# Patient Record
Sex: Female | Born: 1955 | Race: Black or African American | Hispanic: No | Marital: Married | Smoking: Never smoker
Health system: Southern US, Community
[De-identification: ages and names within clinical notes are randomized; demographics above are authoritative.]

## PROBLEM LIST (undated history)

## (undated) ENCOUNTER — Emergency Department (HOSPITAL_COMMUNITY): Discharge status: Absent without leave | Payer: Self-pay

## (undated) DIAGNOSIS — G8929 Other chronic pain: Secondary | ICD-10-CM

## (undated) DIAGNOSIS — G709 Myoneural disorder, unspecified: Secondary | ICD-10-CM

## (undated) DIAGNOSIS — I10 Essential (primary) hypertension: Secondary | ICD-10-CM

## (undated) DIAGNOSIS — M1990 Unspecified osteoarthritis, unspecified site: Secondary | ICD-10-CM

## (undated) DIAGNOSIS — R112 Nausea with vomiting, unspecified: Secondary | ICD-10-CM

## (undated) DIAGNOSIS — Z9889 Other specified postprocedural states: Secondary | ICD-10-CM

## (undated) DIAGNOSIS — Z8719 Personal history of other diseases of the digestive system: Secondary | ICD-10-CM

## (undated) DIAGNOSIS — Z8611 Personal history of tuberculosis: Secondary | ICD-10-CM

## (undated) DIAGNOSIS — Z8701 Personal history of pneumonia (recurrent): Secondary | ICD-10-CM

## (undated) DIAGNOSIS — R202 Paresthesia of skin: Secondary | ICD-10-CM

## (undated) DIAGNOSIS — D649 Anemia, unspecified: Secondary | ICD-10-CM

## (undated) DIAGNOSIS — R51 Headache: Secondary | ICD-10-CM

## (undated) DIAGNOSIS — R519 Headache, unspecified: Secondary | ICD-10-CM

## (undated) DIAGNOSIS — Z973 Presence of spectacles and contact lenses: Secondary | ICD-10-CM

## (undated) DIAGNOSIS — R200 Anesthesia of skin: Secondary | ICD-10-CM

## (undated) DIAGNOSIS — R9431 Abnormal electrocardiogram [ECG] [EKG]: Secondary | ICD-10-CM

## (undated) HISTORY — PX: CHOLECYSTECTOMY: SHX55

## (undated) HISTORY — PX: ESOPHAGOGASTRODUODENOSCOPY: SHX1529

## (undated) HISTORY — PX: COLONOSCOPY: SHX174

---

## 1998-11-22 ENCOUNTER — Ambulatory Visit (HOSPITAL_COMMUNITY): Admission: RE | Admit: 1998-11-22 | Discharge: 1998-11-22 | Payer: Self-pay | Admitting: *Deleted

## 1998-11-22 ENCOUNTER — Encounter: Payer: Self-pay | Admitting: *Deleted

## 1999-09-09 ENCOUNTER — Encounter: Payer: Self-pay | Admitting: Family Medicine

## 1999-09-09 ENCOUNTER — Ambulatory Visit (HOSPITAL_COMMUNITY): Admission: RE | Admit: 1999-09-09 | Discharge: 1999-09-09 | Payer: Self-pay | Admitting: Family Medicine

## 1999-09-15 ENCOUNTER — Encounter: Payer: Self-pay | Admitting: Family Medicine

## 1999-09-15 ENCOUNTER — Ambulatory Visit (HOSPITAL_COMMUNITY): Admission: RE | Admit: 1999-09-15 | Discharge: 1999-09-15 | Payer: Self-pay | Admitting: Family Medicine

## 2000-07-05 ENCOUNTER — Encounter: Payer: Self-pay | Admitting: Family Medicine

## 2000-07-05 ENCOUNTER — Ambulatory Visit (HOSPITAL_COMMUNITY): Admission: RE | Admit: 2000-07-05 | Discharge: 2000-07-05 | Payer: Self-pay | Admitting: Family Medicine

## 2002-01-24 ENCOUNTER — Emergency Department (HOSPITAL_COMMUNITY): Admission: EM | Admit: 2002-01-24 | Discharge: 2002-01-24 | Payer: Self-pay | Admitting: Unknown Physician Specialty

## 2002-01-24 ENCOUNTER — Encounter: Payer: Self-pay | Admitting: Emergency Medicine

## 2002-02-01 ENCOUNTER — Encounter: Payer: Self-pay | Admitting: Family Medicine

## 2002-02-01 ENCOUNTER — Ambulatory Visit (HOSPITAL_COMMUNITY): Admission: RE | Admit: 2002-02-01 | Discharge: 2002-02-01 | Payer: Self-pay | Admitting: Family Medicine

## 2010-06-11 ENCOUNTER — Encounter (INDEPENDENT_AMBULATORY_CARE_PROVIDER_SITE_OTHER): Payer: Self-pay | Admitting: *Deleted

## 2010-10-09 NOTE — Letter (Signed)
Summary: Referral - not able to see patient  [facility]  [address] [place], [place] [address]   Phone: [phone]  Fax: [phone]      June 11, 2010   [facility] [address] [place], [place] [address]    Re:   [name] DOB:  1956/07/19 MRN:   [id number]    Dear Dr. Tinnie Gens:  Thank you for your kind referral of the above patient.  We have attempted to schedule the recommended procedure Screening Colonoscopy but have not been able to schedule because:  ___ The patient was not available by phone and/or has not returned our calls.   X  The patient declined to schedule the procedure at this time.  We appreciate the referral and hope that we will have the opportunity to treat this patient in the future.    Sincerely,    [facility] [phone]

## 2013-03-14 ENCOUNTER — Ambulatory Visit: Payer: Self-pay | Admitting: Podiatry

## 2013-03-15 ENCOUNTER — Ambulatory Visit: Payer: Self-pay | Admitting: Podiatry

## 2013-03-17 ENCOUNTER — Encounter: Payer: Self-pay | Admitting: Podiatry

## 2013-03-17 ENCOUNTER — Ambulatory Visit (INDEPENDENT_AMBULATORY_CARE_PROVIDER_SITE_OTHER): Payer: BC Managed Care – PPO | Admitting: Podiatry

## 2013-03-17 DIAGNOSIS — M722 Plantar fascial fibromatosis: Secondary | ICD-10-CM | POA: Insufficient documentation

## 2013-03-17 DIAGNOSIS — M21969 Unspecified acquired deformity of unspecified lower leg: Secondary | ICD-10-CM | POA: Insufficient documentation

## 2013-03-17 MED ORDER — TRIAMCINOLONE ACETONIDE 40 MG/ML IJ SUSP: 5.0000 mg | Freq: Once | INTRAMUSCULAR | Status: DC

## 2013-03-17 MED ORDER — ARCH BANDAGE MISC: 1.0000 | Status: AC

## 2013-03-17 MED ORDER — DEXAMETHASONE SODIUM PHOSPHATE 10 MG/ML IJ SOLN: 5.0000 mg | Freq: Once | INTRAMUSCULAR | Status: DC

## 2013-03-17 NOTE — Progress Notes (Signed)
Subjective: 57 year old self employed, presents complaining of heel pain when get up in  the morning or after sit for a while, or sometimes when walking, right heel feels like something is stabbing. Has done stretching or rolling pan/ball to stretch without much improvement. No previous medical treatment done. Pain is more on right, sometimes on left also.  Never had this problem before. It was the worst pain ever had.  Wears shoes with good support in arch.  She is scheduled to go on mission trip to [place] this Monday and wants dome immediate relief.   Objective: Pain right plantar heel, occasional pain on left.  Hypermobile first ray bilateral. Elevated first ray bilateral. Mild bunion deformity bilateral. Good ankle dorsiflexion bilateral. Right side is less limber compared with the left side.  Normal forefoot and rearfoot range of motion, muscle strength, and tones. All pedal pulses are palpable. No edema or erythema noted.  Assessment: Plantar fasciitis R>L. Hypermobile first ray bilateral. Bilateral bunion deformity.  Plan: Injection to right heel given. Injection consisted of 5mg  Triamcinolone, 5mg  Dexamethasone, 1 ml of 0.5% Marcaine plain and 1ml of 1% Xylocaine plain. Metatarsal binder x 2 given. Explained the need for Orthotic shoe inserts.  Return in 2 weeks for follow up.

## 2013-03-31 ENCOUNTER — Ambulatory Visit (INDEPENDENT_AMBULATORY_CARE_PROVIDER_SITE_OTHER): Payer: BC Managed Care – PPO | Admitting: Podiatry

## 2013-03-31 ENCOUNTER — Encounter: Payer: Self-pay | Admitting: Podiatry

## 2013-03-31 VITALS — BP 127/80 | HR 97 | Ht 64.0 in | Wt 190.0 lb

## 2013-03-31 DIAGNOSIS — M21969 Unspecified acquired deformity of unspecified lower leg: Secondary | ICD-10-CM

## 2013-03-31 DIAGNOSIS — M722 Plantar fascial fibromatosis: Secondary | ICD-10-CM

## 2013-03-31 MED ORDER — TRIAMCINOLONE ACETONIDE 40 MG/ML IJ SUSP: 5.0000 mg | Freq: Once | INTRAMUSCULAR | Status: DC

## 2013-03-31 MED ORDER — DEXAMETHASONE SODIUM PHOSPHATE 10 MG/ML IJ SOLN: 5.0000 mg | Freq: Once | INTRAMUSCULAR | Status: DC

## 2013-03-31 NOTE — Progress Notes (Signed)
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 She was on her feet helping out with the project. Now her left foot hurt more with leg cramp. The right foot hurts less. Patient wants to have injection on the left and prepare for Orthotics.  Objective: No changes in findings other than much sore heel on the left foot.  No edema or erythema noted on lower limbs.  X-ray both feet done. X-ray findings reveal cavus type foot with significant dorsal displacement of the first ray in lateral view. Plantar calcaneal spur is present in minimum degree. Slightly short first ray with long proximal phalanx of the hallux, which makes long big toe length overall.  Calcaneocuboid lateral deviation is minimum. Subtalar joint subluxation is minimum.  No other abnormal findings noted.   Assessment: 1. Plantar fasciitis bilateral. 2. Metatarsus Primus Elevatus bilateral.  Treatment: 1. Injection to left heel given with a mixture of 4mg  Dexamethasone, 4mg  Triamcinolone and 1ml of 1% Xylocaine plain.  2. X-rays done on both feet. 3. Casted for Orthotics.

## 2013-05-23 ENCOUNTER — Ambulatory Visit (INDEPENDENT_AMBULATORY_CARE_PROVIDER_SITE_OTHER): Payer: BC Managed Care – PPO | Admitting: Podiatry

## 2013-05-23 ENCOUNTER — Encounter: Payer: Self-pay | Admitting: Podiatry

## 2013-05-23 VITALS — BP 125/84 | HR 95 | Ht 64.0 in | Wt 190.0 lb

## 2013-05-23 DIAGNOSIS — M21961 Unspecified acquired deformity of right lower leg: Secondary | ICD-10-CM

## 2013-05-23 DIAGNOSIS — M21969 Unspecified acquired deformity of unspecified lower leg: Secondary | ICD-10-CM

## 2013-05-23 DIAGNOSIS — M722 Plantar fascial fibromatosis: Secondary | ICD-10-CM

## 2013-05-23 MED ORDER — TRIAMCINOLONE ACETONIDE 40 MG/ML IJ SUSP: 5.0000 mg | Freq: Once | INTRAMUSCULAR | Status: DC

## 2013-05-23 MED ORDER — DEXAMETHASONE SODIUM PHOSPHATE 10 MG/ML IJ SOLN: 5.0000 mg | Freq: Once | INTRAMUSCULAR | Status: DC

## 2013-05-23 NOTE — Progress Notes (Signed)
Presents complaining of right heel pain and wants to have injection. Wearing Orthotics and they are helping, but cannot wear the whole day. Need some advice on household shoes.  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  Objective: Right heel pain after been on feet. Excess sagittal plane motion upon loading of forefoot at the Metatarsocuneiform joint R>L. STJ hyperpronation with weight bearing bilateral.  Assessment: Plantar fasciitis right > L. Hypermobile first ray R>L.  Plan: Reviewed clinical findings and available treatment options. Right heel injected with using 1% plain Lidocaine and 4mg  Dexamethasone and 5mg  Triamcinolone. This was well tolerated. Reviewed proper shoe gear. Continue with Orthotics and metatarsal binder. Surgical option also explained.

## 2013-05-23 NOTE — Patient Instructions (Addendum)
Seen for right heel pain and injection given. The result of the injection may vary each time. Discussed shoe selection (raised heel with cushioned sandal for inside house), and surgical options. Continue with Orthotics, Metatarsal binder, good shoe gear, and return as needed. Also discussed about fungal skin on left foot. Continue current level of care with Head N Shoulder shampoo and scrub.

## 2013-07-03 ENCOUNTER — Telehealth: Payer: Self-pay | Admitting: *Deleted

## 2013-07-03 NOTE — Telephone Encounter (Signed)
Patient requests fungal nail prescription. Also would like to buy more Metbinder.

## 2013-07-11 ENCOUNTER — Ambulatory Visit: Payer: BC Managed Care – PPO | Admitting: Podiatry

## 2013-10-26 ENCOUNTER — Other Ambulatory Visit: Payer: Self-pay | Admitting: Obstetrics and Gynecology

## 2013-10-27 ENCOUNTER — Encounter (HOSPITAL_COMMUNITY): Payer: Self-pay | Admitting: Pharmacist

## 2013-10-30 ENCOUNTER — Ambulatory Visit (HOSPITAL_COMMUNITY)
Admission: RE | Admit: 2013-10-30 | Discharge: 2013-10-30 | Disposition: A | Discharge status: Home / Self Care | Payer: BC Managed Care – PPO | Source: Ambulatory Visit | Attending: Obstetrics and Gynecology | Admitting: Obstetrics and Gynecology

## 2013-10-30 ENCOUNTER — Encounter (HOSPITAL_COMMUNITY)
Admission: RE | Disposition: A | Discharge status: Home / Self Care | Payer: Self-pay | Source: Ambulatory Visit | Attending: Obstetrics and Gynecology

## 2013-10-30 ENCOUNTER — Ambulatory Visit (HOSPITAL_COMMUNITY): Payer: BC Managed Care – PPO | Admitting: Anesthesiology

## 2013-10-30 ENCOUNTER — Encounter (HOSPITAL_COMMUNITY): Payer: BC Managed Care – PPO | Admitting: Anesthesiology

## 2013-10-30 DIAGNOSIS — D250 Submucous leiomyoma of uterus: Secondary | ICD-10-CM | POA: Insufficient documentation

## 2013-10-30 DIAGNOSIS — N840 Polyp of corpus uteri: Secondary | ICD-10-CM

## 2013-10-30 DIAGNOSIS — N950 Postmenopausal bleeding: Secondary | ICD-10-CM

## 2013-10-30 HISTORY — PX: DILATATION & CURRETTAGE/HYSTEROSCOPY WITH RESECTOCOPE: SHX5572

## 2013-10-30 LAB — CBC
HEMATOCRIT: 43.6 % (ref 36.0–46.0)
Hemoglobin: 15.5 g/dL — ABNORMAL HIGH (ref 12.0–15.0)
MCH: 31.1 pg (ref 26.0–34.0)
MCHC: 35.6 g/dL (ref 30.0–36.0)
MCV: 87.6 fL (ref 78.0–100.0)
Platelets: 252 10*3/uL (ref 150–400)
RBC: 4.98 MIL/uL (ref 3.87–5.11)
RDW: 13.8 % (ref 11.5–15.5)
WBC: 8.4 10*3/uL (ref 4.0–10.5)

## 2013-10-30 SURGERY — DILATATION & CURETTAGE/HYSTEROSCOPY WITH RESECTOCOPE
Anesthesia: General | Site: Vagina

## 2013-10-30 MED ORDER — MIDAZOLAM HCL 2 MG/2ML IJ SOLN
INTRAMUSCULAR | Status: AC
  Filled 2013-10-30: qty 2

## 2013-10-30 MED ORDER — KETOROLAC TROMETHAMINE 30 MG/ML IJ SOLN
INTRAMUSCULAR | Status: AC
  Filled 2013-10-30: qty 2

## 2013-10-30 MED ORDER — PROPOFOL 10 MG/ML IV EMUL
INTRAVENOUS | Status: AC
  Filled 2013-10-30: qty 20

## 2013-10-30 MED ORDER — MIDAZOLAM HCL 5 MG/5ML IJ SOLN
INTRAMUSCULAR | Status: DC | PRN
  Administered 2013-10-30: 2 mg via INTRAVENOUS

## 2013-10-30 MED ORDER — FENTANYL CITRATE 0.05 MG/ML IJ SOLN
INTRAMUSCULAR | Status: AC
  Filled 2013-10-30: qty 2

## 2013-10-30 MED ORDER — GLYCINE 1.5 % IR SOLN
Status: DC | PRN
  Administered 2013-10-30: 3000 mL

## 2013-10-30 MED ORDER — DEXAMETHASONE SODIUM PHOSPHATE 10 MG/ML IJ SOLN
INTRAMUSCULAR | Status: AC
  Filled 2013-10-30: qty 1

## 2013-10-30 MED ORDER — FENTANYL CITRATE 0.05 MG/ML IJ SOLN
INTRAMUSCULAR | Status: DC | PRN
  Administered 2013-10-30: 100 ug via INTRAVENOUS

## 2013-10-30 MED ORDER — CHLOROPROCAINE HCL 1 % IJ SOLN
INTRAMUSCULAR | Status: AC
  Filled 2013-10-30: qty 30

## 2013-10-30 MED ORDER — IBUPROFEN 800 MG PO TABS: 800.0000 mg | ORAL_TABLET | Freq: Three times a day (TID) | ORAL | Status: DC | PRN

## 2013-10-30 MED ORDER — FENTANYL CITRATE 0.05 MG/ML IJ SOLN: 25.0000 ug | INTRAMUSCULAR | Status: DC | PRN

## 2013-10-30 MED ORDER — KETOROLAC TROMETHAMINE 30 MG/ML IJ SOLN
INTRAMUSCULAR | Status: DC | PRN
  Administered 2013-10-30: 30 mg via INTRAMUSCULAR
  Administered 2013-10-30: 30 mg via INTRAVENOUS

## 2013-10-30 MED ORDER — DEXAMETHASONE SODIUM PHOSPHATE 10 MG/ML IJ SOLN
INTRAMUSCULAR | Status: DC | PRN
  Administered 2013-10-30: 10 mg via INTRAVENOUS

## 2013-10-30 MED ORDER — ONDANSETRON HCL 4 MG/2ML IJ SOLN
INTRAMUSCULAR | Status: AC
  Filled 2013-10-30: qty 2

## 2013-10-30 MED ORDER — ONDANSETRON HCL 4 MG/2ML IJ SOLN
INTRAMUSCULAR | Status: DC | PRN
  Administered 2013-10-30: 4 mg via INTRAVENOUS

## 2013-10-30 MED ORDER — LIDOCAINE HCL (CARDIAC) 20 MG/ML IV SOLN
INTRAVENOUS | Status: AC
  Filled 2013-10-30: qty 5

## 2013-10-30 MED ORDER — LIDOCAINE HCL (CARDIAC) 20 MG/ML IV SOLN
INTRAVENOUS | Status: DC | PRN
  Administered 2013-10-30: 50 mg via INTRAVENOUS

## 2013-10-30 MED ORDER — LACTATED RINGERS IV SOLN
INTRAVENOUS | Status: DC
  Administered 2013-10-30: 07:00:00 via INTRAVENOUS

## 2013-10-30 MED ORDER — PROPOFOL 10 MG/ML IV BOLUS
INTRAVENOUS | Status: DC | PRN
  Administered 2013-10-30: 200 mg via INTRAVENOUS

## 2013-10-30 SURGICAL SUPPLY — 23 items
CANISTER SUCT 3000ML (MISCELLANEOUS) ×2 IMPLANT
CATH ROBINSON RED A/P 16FR (CATHETERS) ×2 IMPLANT
CLOTH BEACON ORANGE TIMEOUT ST (SAFETY) ×2 IMPLANT
CONTAINER PREFILL 10% NBF 60ML (FORM) ×3 IMPLANT
DRAPE HYSTEROSCOPY (DRAPE) ×2 IMPLANT
DRSG TELFA 3X8 NADH (GAUZE/BANDAGES/DRESSINGS) ×2 IMPLANT
ELECT REM PT RETURN 9FT ADLT (ELECTROSURGICAL) ×2
ELECTRODE REM PT RTRN 9FT ADLT (ELECTROSURGICAL) ×1 IMPLANT
GLOVE BIOGEL PI IND STRL 7.0 (GLOVE) ×2 IMPLANT
GLOVE BIOGEL PI INDICATOR 7.0 (GLOVE) ×2
GLOVE ECLIPSE 6.5 STRL STRAW (GLOVE) ×2 IMPLANT
GOWN STRL REUS W/TWL LRG LVL3 (GOWN DISPOSABLE) ×4 IMPLANT
LOOP ANGLED CUTTING 22FR (CUTTING LOOP) ×1 IMPLANT
NDL SPNL 22GX3.5 QUINCKE BK (NEEDLE) ×1 IMPLANT
NEEDLE SPNL 22GX3.5 QUINCKE BK (NEEDLE) ×2 IMPLANT
PACK VAGINAL MINOR WOMEN LF (CUSTOM PROCEDURE TRAY) ×2 IMPLANT
PAD DRESSING TELFA 3X8 NADH (GAUZE/BANDAGES/DRESSINGS) ×1 IMPLANT
PAD OB MATERNITY 4.3X12.25 (PERSONAL CARE ITEMS) ×2 IMPLANT
SET TUBING HYSTEROSCOPY 2 NDL (TUBING) ×1 IMPLANT
SYR CONTROL 10ML LL (SYRINGE) ×2 IMPLANT
TOWEL OR 17X24 6PK STRL BLUE (TOWEL DISPOSABLE) ×4 IMPLANT
TUBE HYSTEROSCOPY W Y-CONNECT (TUBING) ×1 IMPLANT
WATER STERILE IRR 1000ML POUR (IV SOLUTION) ×2 IMPLANT

## 2013-10-30 NOTE — Transfer of Care (Signed)
Immediate Anesthesia Transfer of Care Note  Patient: [name]  Procedure(s) Performed: Procedure(s) with comments: DILATATION & CURETTAGE/HYSTEROSCOPY WITH RESECTOCOPE (N/A) - 1 hr.  Patient Location: PACU  Anesthesia Type:General  Level of Consciousness: awake, alert , oriented and patient cooperative  Airway & Oxygen Therapy: Patient Spontanous Breathing and Patient connected to nasal cannula oxygen  Post-op Assessment: Report given to PACU RN, Post -op Vital signs reviewed and stable and Patient moving all extremities X 4  Post vital signs: Reviewed and stable  Complications: No apparent anesthesia complications

## 2013-10-30 NOTE — Anesthesia Postprocedure Evaluation (Signed)
  Anesthesia Post-op Note  Patient: [name]  Procedure(s) Performed: Procedure(s) with comments: DILATATION & CURETTAGE/HYSTEROSCOPY WITH RESECTOCOPE (N/A) - 1 hr. Patient is awake and responsive. Pain and nausea are reasonably well controlled. Vital signs are stable and clinically acceptable. Oxygen saturation is clinically acceptable. There are no apparent anesthetic complications at this time. Patient is ready for discharge.

## 2013-10-30 NOTE — Discharge Instructions (Signed)
YOU MAY TAKE IBUPROFEN FOR CRAMPING AFTER 2:00 PM TODAY   Post Anesthesia Home Care Instructions  Activity: Get plenty of rest for the remainder of the day. A responsible adult should stay with you for 24 hours following the procedure.  For the next 24 hours, DO NOT: -Drive a car -Paediatric nurse -Drink alcoholic beverages -Take any medication unless instructed by your physician -Make any legal decisions or sign important papers.  Meals: Start with liquid foods such as gelatin or soup. Progress to regular foods as tolerated. Avoid greasy, spicy, heavy foods. If nausea and/or vomiting occur, drink only clear liquids until the nausea and/or vomiting subsides. Call your physician if vomiting continues.  Special Instructions/Symptoms: Your throat may feel dry or sore from the anesthesia or the breathing tube placed in your throat during surgery. If this causes discomfort, gargle with warm salt water. The discomfort should disappear within 24 hours.

## 2013-10-30 NOTE — Brief Op Note (Signed)
10/30/2013  8:57 AM  PATIENT:  [name]  58 y.o. female  PRE-OPERATIVE DIAGNOSIS:  Postmenopausal Bleeding, Endometrial Mass  POST-OPERATIVE DIAGNOSIS:  Postmenopausal Bleeding, Endometrial Mass  PROCEDURE:  Diagnostic hysteroscopy, dilation and curettage, hysteroscopic resection of endometrial polyps, submucosal fibroid  SURGEON:  Surgeon(s) and Role:    * Zina Pitzer Clint Bolder, MD - Primary  PHYSICIAN ASSISTANT:   ASSISTANTS: none   ANESTHESIA:   general FINDINGS: endometrial polyps, small Sm fibroids, tubal ostia seen nl endocervical canal  EBL:  Total I/O In: 1400 [I.V.:1400] Out: 75 [Urine:50; Blood:25]  BLOOD ADMINISTERED:none  DRAINS: none   LOCAL MEDICATIONS USED:  NONE  SPECIMEN:  Source of Specimen:  EMC with polyps  DISPOSITION OF SPECIMEN:  PATHOLOGY  COUNTS:  YES  TOURNIQUET:  * No tourniquets in log *  DICTATION: .Other Dictation: Dictation Number [id number]  PLAN OF CARE: Discharge to home after PACU  PATIENT DISPOSITION:  PACU - hemodynamically stable.   Delay start of Pharmacological VTE agent (>24hrs) due to surgical blood loss or risk of bleeding: no

## 2013-10-30 NOTE — Anesthesia Preprocedure Evaluation (Signed)
Anesthesia Evaluation  Patient identified by MRN, date of birth, ID band Patient awake    Reviewed: Allergy & Precautions, H&P , Patient's Chart, lab work & pertinent test results, reviewed documented beta blocker date and time   Airway Mallampati: II TM Distance: >3 FB Neck ROM: full    Dental no notable dental hx.    Pulmonary  breath sounds clear to auscultation  Pulmonary exam normal       Cardiovascular Rhythm:regular Rate:Normal     Neuro/Psych    GI/Hepatic   Endo/Other    Renal/GU      Musculoskeletal   Abdominal   Peds  Hematology   Anesthesia Other Findings   Reproductive/Obstetrics                           Anesthesia Physical Anesthesia Plan  ASA: II  Anesthesia Plan:    Post-op Pain Management:    Induction: Intravenous  Airway Management Planned: LMA  Additional Equipment:   Intra-op Plan:   Post-operative Plan:   Informed Consent: I have reviewed the patients History and Physical, chart, labs and discussed the procedure including the risks, benefits and alternatives for the proposed anesthesia with the patient or authorized representative who has indicated his/her understanding and acceptance.   Dental Advisory Given and Dental advisory given  Plan Discussed with: CRNA and Surgeon  Anesthesia Plan Comments: (Discussed GA with LMA, possible sore throat, potential need to switch to ETT, N/V, pulmonary aspiration. Questions answered. )        Anesthesia Quick Evaluation

## 2013-10-31 ENCOUNTER — Encounter (HOSPITAL_COMMUNITY): Payer: Self-pay | Admitting: Obstetrics and Gynecology

## 2013-10-31 NOTE — Op Note (Signed)
[name], [name]           ACCOUNT NO.:  [id number]  MEDICAL RECORD NO.:  [id number]  LOCATION:  [facility]                          FACILITY:  [facility]  PHYSICIAN:  Servando Salina, M.D.DATE OF BIRTH:  14-Jun-1956  DATE OF PROCEDURE:  10/30/2013 DATE OF DISCHARGE:  10/30/2013                              OPERATIVE REPORT   PREOPERATIVE DIAGNOSES:  Postmenopausal bleeding.  Endometrial masses.  PROCEDURE:  Diagnostic hysteroscopy, hysteroscopic resection of endometrial masses, dilation and curettage.  POSTOPERATIVE DIAGNOSES:  Postmenopausal bleeding, endometrial polyp, submucosal fibroid.  ANESTHESIA:  General.  SURGEON:  Servando Salina, MD  ASSISTANT:  None.  DESCRIPTION OF PROCEDURE:  Under adequate general anesthesia, the patient was placed in a dorsal lithotomy position.  She was sterilely prepped and draped in usual fashion.  The bladder was catheterized with mild amount of urine.  Examination under anesthesia revealed an anteverted uterus.  No adnexal masses could be appreciated.  A bivalve speculum was placed in the vagina.  A single-tooth tenaculum was placed on the anterior lip of the cervix.  The cervix was parous, easily dilated up to #[facility] dilator.  A glycine primed resectoscope with a single loop was inserted in the uterine cavity.  The cavity was inspected.  Both tubal ostia could be seen.  There was a small submucosal posterior fibroid noted on the upper aspect of the uterus. On the right lower uterine segment and on the left upper lateral lower uterine segment were 2 polypoid lesions, all of which were resected and the pieces were removed.  The cavity was then curetted.  The resectoscope was then inserted after the curettage and no other lesions were noted. The endocervical canal was inspected, no lesions were noted.  At that point, the procedure was felt to be complete.  All instruments were then removed from the vagina.  SPECIMEN LABELED:   Endometrial curetting with polyps, fibroid resection sent to Pathology.  ESTIMATED BLOOD LOSS: 25 mL.  INTRAOPERATIVE FLUIDS:  1 L.  URINE OUTPUT: 50 cc  FLUID DEFICIT:  160.  SPONGE AND INSTRUMENT COUNTS:  X2 was correct.  COMPLICATIONS:  None.  The patient tolerated the procedure well, was transferred to recovery room in stable condition.     Servando Salina, M.D.     [place]/MEDQ  D:  10/30/2013  T:  10/31/2013  Job:  [id number]

## 2015-01-17 ENCOUNTER — Ambulatory Visit
Admission: RE | Admit: 2015-01-17 | Discharge: 2015-01-17 | Disposition: A | Discharge status: Home / Self Care | Payer: 59 | Source: Ambulatory Visit | Attending: Family Medicine | Admitting: Family Medicine

## 2015-01-17 ENCOUNTER — Other Ambulatory Visit: Payer: Self-pay | Admitting: Family Medicine

## 2015-01-17 DIAGNOSIS — R52 Pain, unspecified: Secondary | ICD-10-CM

## 2015-10-24 ENCOUNTER — Other Ambulatory Visit (HOSPITAL_COMMUNITY): Payer: Self-pay | Admitting: Rheumatology

## 2015-10-24 DIAGNOSIS — M5410 Radiculopathy, site unspecified: Secondary | ICD-10-CM

## 2015-10-24 DIAGNOSIS — M542 Cervicalgia: Secondary | ICD-10-CM

## 2015-11-05 ENCOUNTER — Ambulatory Visit (HOSPITAL_COMMUNITY)
Admission: RE | Admit: 2015-11-05 | Discharge: 2015-11-05 | Disposition: A | Discharge status: Home / Self Care | Payer: BLUE CROSS/BLUE SHIELD | Source: Ambulatory Visit | Attending: Rheumatology | Admitting: Rheumatology

## 2015-11-05 DIAGNOSIS — M5410 Radiculopathy, site unspecified: Secondary | ICD-10-CM | POA: Diagnosis present

## 2015-11-05 DIAGNOSIS — M50123 Cervical disc disorder at C6-C7 level with radiculopathy: Secondary | ICD-10-CM | POA: Diagnosis not present

## 2015-11-05 DIAGNOSIS — M542 Cervicalgia: Secondary | ICD-10-CM

## 2015-11-05 DIAGNOSIS — M4802 Spinal stenosis, cervical region: Secondary | ICD-10-CM | POA: Diagnosis not present

## 2015-11-05 DIAGNOSIS — M50121 Cervical disc disorder at C4-C5 level with radiculopathy: Secondary | ICD-10-CM | POA: Insufficient documentation

## 2015-11-22 DIAGNOSIS — M722 Plantar fascial fibromatosis: Secondary | ICD-10-CM

## 2016-02-06 HISTORY — PX: ANTERIOR CERVICAL DECOMP/DISCECTOMY FUSION: SHX1161

## 2016-02-10 NOTE — Pre-Procedure Instructions (Signed)
    [name]  02/10/2016      [facility] [place], [place] [name] [address] Phone: [phone] Fax: [phone]    Your procedure is scheduled on Wednesday 02/19/16.  Report to [facility] Admitting at 10:30 A.M.  Call this number if you have problems the morning of surgery:  [phone]   Remember:  Do not eat food or drink liquids after midnight.  Take these medicines the morning of surgery with A SIP OF WATER: NONE.  Stop taking: Aspirin, NSAIDS, Ibuprofen, Advil, Motrin, BC's, Goody's, Fish oil, all herbal medications, and all vitamins.    Do not wear jewelry, make-up or nail polish.  Do not wear lotions, powders, or perfumes.    Do not shave 48 hours prior to surgery.    Do not bring valuables to the hospital.   [facility] is not responsible for any belongings or valuables.  Contacts, dentures or bridgework may not be worn into surgery.  Leave your suitcase in the car.  After surgery it may be brought to your room.  For patients admitted to the hospital, discharge time will be determined by your treatment team.  Patients discharged the day of surgery will not be allowed to drive home.   Special instructions:  See attached: Preparing for Surgery.   Please read over the following fact sheets that you were given. MRSA Information

## 2016-02-11 ENCOUNTER — Encounter (HOSPITAL_COMMUNITY)
Admission: RE | Admit: 2016-02-11 | Discharge: 2016-02-11 | Disposition: A | Discharge status: Home / Self Care | Payer: BLUE CROSS/BLUE SHIELD | Source: Ambulatory Visit | Attending: Orthopaedic Surgery | Admitting: Orthopaedic Surgery

## 2016-02-11 ENCOUNTER — Encounter (HOSPITAL_COMMUNITY): Payer: Self-pay

## 2016-02-11 ENCOUNTER — Ambulatory Visit (HOSPITAL_COMMUNITY)
Admission: RE | Admit: 2016-02-11 | Discharge: 2016-02-11 | Disposition: A | Discharge status: Home / Self Care | Payer: BLUE CROSS/BLUE SHIELD | Source: Ambulatory Visit | Attending: Surgery | Admitting: Surgery

## 2016-02-11 DIAGNOSIS — Z01818 Encounter for other preprocedural examination: Secondary | ICD-10-CM | POA: Insufficient documentation

## 2016-02-11 DIAGNOSIS — Z01812 Encounter for preprocedural laboratory examination: Secondary | ICD-10-CM | POA: Diagnosis not present

## 2016-02-11 DIAGNOSIS — I10 Essential (primary) hypertension: Secondary | ICD-10-CM | POA: Diagnosis not present

## 2016-02-11 HISTORY — DX: Unspecified osteoarthritis, unspecified site: M19.90

## 2016-02-11 HISTORY — DX: Personal history of pneumonia (recurrent): Z87.01

## 2016-02-11 HISTORY — DX: Abnormal electrocardiogram (ECG) (EKG): R94.31

## 2016-02-11 HISTORY — DX: Presence of spectacles and contact lenses: Z97.3

## 2016-02-11 HISTORY — DX: Anemia, unspecified: D64.9

## 2016-02-11 HISTORY — DX: Other specified postprocedural states: Z98.890

## 2016-02-11 HISTORY — DX: Headache, unspecified: R51.9

## 2016-02-11 HISTORY — DX: Anesthesia of skin: R20.0

## 2016-02-11 HISTORY — DX: Headache: R51

## 2016-02-11 HISTORY — DX: Paresthesia of skin: R20.2

## 2016-02-11 HISTORY — DX: Nausea with vomiting, unspecified: R11.2

## 2016-02-11 HISTORY — DX: Other chronic pain: G89.29

## 2016-02-11 HISTORY — DX: Personal history of other diseases of the digestive system: Z87.19

## 2016-02-11 HISTORY — DX: Personal history of tuberculosis: Z86.11

## 2016-02-11 HISTORY — DX: Essential (primary) hypertension: I10

## 2016-02-11 LAB — COMPREHENSIVE METABOLIC PANEL
ALT: 28 U/L (ref 14–54)
AST: 22 U/L (ref 15–41)
Albumin: 4 g/dL (ref 3.5–5.0)
Alkaline Phosphatase: 63 U/L (ref 38–126)
Anion gap: 5 (ref 5–15)
BUN: 7 mg/dL (ref 6–20)
CHLORIDE: 109 mmol/L (ref 101–111)
CO2: 29 mmol/L (ref 22–32)
CREATININE: 0.62 mg/dL (ref 0.44–1.00)
Calcium: 9.7 mg/dL (ref 8.9–10.3)
Glucose, Bld: 102 mg/dL — ABNORMAL HIGH (ref 65–99)
POTASSIUM: 4.1 mmol/L (ref 3.5–5.1)
SODIUM: 143 mmol/L (ref 135–145)
Total Bilirubin: 0.6 mg/dL (ref 0.3–1.2)
Total Protein: 6.9 g/dL (ref 6.5–8.1)

## 2016-02-11 LAB — CBC
HCT: 45.3 % (ref 36.0–46.0)
Hemoglobin: 14.7 g/dL (ref 12.0–15.0)
MCH: 30 pg (ref 26.0–34.0)
MCHC: 32.5 g/dL (ref 30.0–36.0)
MCV: 92.4 fL (ref 78.0–100.0)
PLATELETS: 222 10*3/uL (ref 150–400)
RBC: 4.9 MIL/uL (ref 3.87–5.11)
RDW: 13.6 % (ref 11.5–15.5)
WBC: 6.8 10*3/uL (ref 4.0–10.5)

## 2016-02-11 LAB — URINALYSIS, ROUTINE W REFLEX MICROSCOPIC
BILIRUBIN URINE: NEGATIVE
GLUCOSE, UA: NEGATIVE mg/dL
HGB URINE DIPSTICK: NEGATIVE
KETONES UR: NEGATIVE mg/dL
Leukocytes, UA: NEGATIVE
Nitrite: NEGATIVE
PH: 8 (ref 5.0–8.0)
Protein, ur: NEGATIVE mg/dL
Specific Gravity, Urine: 1.015 (ref 1.005–1.030)

## 2016-02-11 LAB — APTT: aPTT: 31 seconds (ref 24–37)

## 2016-02-11 LAB — SURGICAL PCR SCREEN
MRSA, PCR: NEGATIVE
STAPHYLOCOCCUS AUREUS: NEGATIVE

## 2016-02-11 LAB — PROTIME-INR
INR: 0.94 (ref 0.00–1.49)
PROTHROMBIN TIME: 12.8 s (ref 11.6–15.2)

## 2016-02-11 NOTE — Progress Notes (Signed)
PCP - Dr. Lucianne Lei Cardiologist - denies  EKG - 02/11/16 CXR - 02/11/16  Echo/Stress test/Cardiac Cath - denies  Patient denies chest pain and shortness of breath at PAT appointment.

## 2016-02-18 MED ORDER — VANCOMYCIN HCL IN DEXTROSE 1-5 GM/200ML-% IV SOLN
1000.0000 mg | INTRAVENOUS | Status: AC
  Administered 2016-02-19: 1000 mg via INTRAVENOUS
  Filled 2016-02-18: qty 200

## 2016-02-19 ENCOUNTER — Ambulatory Visit (HOSPITAL_COMMUNITY): Payer: BLUE CROSS/BLUE SHIELD | Admitting: Anesthesiology

## 2016-02-19 ENCOUNTER — Ambulatory Visit (HOSPITAL_COMMUNITY): Payer: BLUE CROSS/BLUE SHIELD

## 2016-02-19 ENCOUNTER — Observation Stay (HOSPITAL_COMMUNITY)
Admission: AD | Admit: 2016-02-19 | Discharge: 2016-02-20 | DRG: 472 | Disposition: A | Discharge status: Home / Self Care | Payer: BLUE CROSS/BLUE SHIELD | Source: Ambulatory Visit | Attending: Orthopaedic Surgery | Admitting: Orthopaedic Surgery

## 2016-02-19 ENCOUNTER — Encounter (HOSPITAL_COMMUNITY)
Admission: AD | Disposition: A | Discharge status: Home / Self Care | Payer: Self-pay | Source: Ambulatory Visit | Attending: Orthopaedic Surgery

## 2016-02-19 ENCOUNTER — Encounter (HOSPITAL_COMMUNITY): Payer: Self-pay | Admitting: *Deleted

## 2016-02-19 DIAGNOSIS — G5601 Carpal tunnel syndrome, right upper limb: Secondary | ICD-10-CM | POA: Diagnosis not present

## 2016-02-19 DIAGNOSIS — Q761 Klippel-Feil syndrome: Secondary | ICD-10-CM | POA: Diagnosis not present

## 2016-02-19 DIAGNOSIS — M4802 Spinal stenosis, cervical region: Secondary | ICD-10-CM | POA: Diagnosis not present

## 2016-02-19 DIAGNOSIS — M532X2 Spinal instabilities, cervical region: Secondary | ICD-10-CM | POA: Diagnosis not present

## 2016-02-19 DIAGNOSIS — Z9889 Other specified postprocedural states: Secondary | ICD-10-CM

## 2016-02-19 DIAGNOSIS — M4722 Other spondylosis with radiculopathy, cervical region: Secondary | ICD-10-CM | POA: Insufficient documentation

## 2016-02-19 DIAGNOSIS — Z79899 Other long term (current) drug therapy: Secondary | ICD-10-CM | POA: Insufficient documentation

## 2016-02-19 DIAGNOSIS — G8929 Other chronic pain: Secondary | ICD-10-CM | POA: Insufficient documentation

## 2016-02-19 DIAGNOSIS — Z419 Encounter for procedure for purposes other than remedying health state, unspecified: Secondary | ICD-10-CM

## 2016-02-19 DIAGNOSIS — I10 Essential (primary) hypertension: Secondary | ICD-10-CM | POA: Diagnosis not present

## 2016-02-19 DIAGNOSIS — M542 Cervicalgia: Secondary | ICD-10-CM | POA: Diagnosis present

## 2016-02-19 DIAGNOSIS — G9589 Other specified diseases of spinal cord: Secondary | ICD-10-CM | POA: Diagnosis not present

## 2016-02-19 HISTORY — PX: CARPAL TUNNEL RELEASE: SHX101

## 2016-02-19 HISTORY — PX: ANTERIOR CERVICAL DECOMP/DISCECTOMY FUSION: SHX1161

## 2016-02-19 SURGERY — ANTERIOR CERVICAL DECOMPRESSION/DISCECTOMY FUSION 2 LEVELS
Anesthesia: General | Laterality: Right

## 2016-02-19 MED ORDER — OXYCODONE-ACETAMINOPHEN 5-325 MG PO TABS: 1.0000 | ORAL_TABLET | Freq: Four times a day (QID) | ORAL | Status: DC | PRN

## 2016-02-19 MED ORDER — METOCLOPRAMIDE HCL 5 MG PO TABS: 5.0000 mg | ORAL_TABLET | Freq: Three times a day (TID) | ORAL | Status: DC | PRN

## 2016-02-19 MED ORDER — FENTANYL CITRATE (PF) 250 MCG/5ML IJ SOLN
INTRAMUSCULAR | Status: AC
  Filled 2016-02-19: qty 5

## 2016-02-19 MED ORDER — HEMOSTATIC AGENTS (NO CHARGE) OPTIME
TOPICAL | Status: DC | PRN
  Administered 2016-02-19: 1 via TOPICAL

## 2016-02-19 MED ORDER — PHENOL 1.4 % MT LIQD
1.0000 | OROMUCOSAL | Status: DC | PRN
  Administered 2016-02-19: 1 via OROMUCOSAL
  Filled 2016-02-19: qty 177

## 2016-02-19 MED ORDER — LIDOCAINE HCL 4 % EX SOLN
CUTANEOUS | Status: DC | PRN
  Administered 2016-02-19: 3 mL via TOPICAL

## 2016-02-19 MED ORDER — CHLORHEXIDINE GLUCONATE 4 % EX LIQD
60.0000 mL | Freq: Once | CUTANEOUS | Status: DC
Start: 2016-02-19 — End: 2016-02-19

## 2016-02-19 MED ORDER — MIDAZOLAM HCL 2 MG/2ML IJ SOLN
INTRAMUSCULAR | Status: AC
  Filled 2016-02-19: qty 2

## 2016-02-19 MED ORDER — GLYCOPYRROLATE 0.2 MG/ML IJ SOLN
INTRAMUSCULAR | Status: DC | PRN
  Administered 2016-02-19: .4 mg via INTRAVENOUS

## 2016-02-19 MED ORDER — NEOSTIGMINE METHYLSULFATE 10 MG/10ML IV SOLN
INTRAVENOUS | Status: DC | PRN
  Administered 2016-02-19: 3 mg via INTRAVENOUS

## 2016-02-19 MED ORDER — IRBESARTAN 150 MG PO TABS: 150.0000 mg | ORAL_TABLET | Freq: Every day | ORAL | Status: DC

## 2016-02-19 MED ORDER — BUPIVACAINE-EPINEPHRINE 0.25% -1:200000 IJ SOLN
INTRAMUSCULAR | Status: DC | PRN
  Administered 2016-02-19: 6 mL

## 2016-02-19 MED ORDER — METHOCARBAMOL 1000 MG/10ML IJ SOLN
500.0000 mg | Freq: Four times a day (QID) | INTRAVENOUS | Status: DC | PRN
  Filled 2016-02-19: qty 5

## 2016-02-19 MED ORDER — METOCLOPRAMIDE HCL 5 MG/ML IJ SOLN
5.0000 mg | Freq: Three times a day (TID) | INTRAMUSCULAR | Status: DC | PRN
  Administered 2016-02-19 – 2016-02-20 (×2): 10 mg via INTRAVENOUS
  Filled 2016-02-19 (×2): qty 2

## 2016-02-19 MED ORDER — FENTANYL CITRATE (PF) 250 MCG/5ML IJ SOLN
INTRAMUSCULAR | Status: DC | PRN
  Administered 2016-02-19: 100 ug via INTRAVENOUS
  Administered 2016-02-19: 150 ug via INTRAVENOUS

## 2016-02-19 MED ORDER — ONDANSETRON HCL 4 MG/2ML IJ SOLN
INTRAMUSCULAR | Status: AC
  Filled 2016-02-19: qty 2

## 2016-02-19 MED ORDER — DOCUSATE SODIUM 100 MG PO CAPS
100.0000 mg | ORAL_CAPSULE | Freq: Two times a day (BID) | ORAL | Status: DC
  Administered 2016-02-20: 100 mg via ORAL
  Filled 2016-02-19 (×2): qty 1

## 2016-02-19 MED ORDER — PHENYLEPHRINE HCL 10 MG/ML IJ SOLN
10.0000 mg | INTRAVENOUS | Status: DC | PRN
  Administered 2016-02-19: 10 ug/min via INTRAVENOUS

## 2016-02-19 MED ORDER — LIDOCAINE HCL (CARDIAC) 20 MG/ML IV SOLN
INTRAVENOUS | Status: DC | PRN
  Administered 2016-02-19: 60 mg via INTRAVENOUS

## 2016-02-19 MED ORDER — SODIUM CHLORIDE 0.9% FLUSH
3.0000 mL | Freq: Two times a day (BID) | INTRAVENOUS | Status: DC
  Administered 2016-02-19 – 2016-02-20 (×2): 3 mL via INTRAVENOUS

## 2016-02-19 MED ORDER — SODIUM CHLORIDE 0.9% FLUSH: 3.0000 mL | INTRAVENOUS | Status: DC | PRN

## 2016-02-19 MED ORDER — GLYCOPYRROLATE 0.2 MG/ML IV SOSY
PREFILLED_SYRINGE | INTRAVENOUS | Status: AC
  Filled 2016-02-19: qty 3

## 2016-02-19 MED ORDER — BUPIVACAINE-EPINEPHRINE (PF) 0.25% -1:200000 IJ SOLN
INTRAMUSCULAR | Status: AC
  Filled 2016-02-19: qty 30

## 2016-02-19 MED ORDER — METOPROLOL TARTRATE 5 MG/5ML IV SOLN
INTRAVENOUS | Status: AC
  Filled 2016-02-19: qty 5

## 2016-02-19 MED ORDER — LIDOCAINE 2% (20 MG/ML) 5 ML SYRINGE
INTRAMUSCULAR | Status: AC
  Filled 2016-02-19: qty 5

## 2016-02-19 MED ORDER — ONDANSETRON HCL 4 MG/2ML IJ SOLN: 4.0000 mg | Freq: Four times a day (QID) | INTRAMUSCULAR | Status: DC | PRN

## 2016-02-19 MED ORDER — NEOSTIGMINE METHYLSULFATE 5 MG/5ML IV SOSY
PREFILLED_SYRINGE | INTRAVENOUS | Status: AC
  Filled 2016-02-19: qty 5

## 2016-02-19 MED ORDER — ROCURONIUM BROMIDE 100 MG/10ML IV SOLN
INTRAVENOUS | Status: DC | PRN
  Administered 2016-02-19: 50 mg via INTRAVENOUS

## 2016-02-19 MED ORDER — MIDAZOLAM HCL 2 MG/2ML IJ SOLN
INTRAMUSCULAR | Status: DC | PRN
  Administered 2016-02-19: 2 mg via INTRAVENOUS

## 2016-02-19 MED ORDER — SODIUM CHLORIDE 0.9 % IV SOLN: 250.0000 mL | INTRAVENOUS | Status: DC

## 2016-02-19 MED ORDER — HYDROMORPHONE HCL 1 MG/ML IJ SOLN: 0.2500 mg | INTRAMUSCULAR | Status: DC | PRN

## 2016-02-19 MED ORDER — POLYETHYLENE GLYCOL 3350 17 G PO PACK: 17.0000 g | PACK | Freq: Every day | ORAL | Status: DC | PRN

## 2016-02-19 MED ORDER — HYDROMORPHONE HCL 1 MG/ML IJ SOLN: 0.5000 mg | INTRAMUSCULAR | Status: DC | PRN

## 2016-02-19 MED ORDER — MAGNESIUM OXIDE 400 MG PO TABS: 400.0000 mg | ORAL_TABLET | Freq: Every day | ORAL | Status: DC

## 2016-02-19 MED ORDER — SODIUM CHLORIDE 0.45 % IV SOLN
INTRAVENOUS | Status: DC
  Administered 2016-02-19: 19:00:00 via INTRAVENOUS

## 2016-02-19 MED ORDER — ONDANSETRON HCL 4 MG/2ML IJ SOLN
INTRAMUSCULAR | Status: DC | PRN
  Administered 2016-02-19: 4 mg via INTRAVENOUS

## 2016-02-19 MED ORDER — AMLODIPINE BESYLATE 10 MG PO TABS
10.0000 mg | ORAL_TABLET | Freq: Every day | ORAL | Status: DC
Start: 2016-02-19 — End: 2016-02-20
  Administered 2016-02-20: 10 mg via ORAL
  Filled 2016-02-19: qty 1

## 2016-02-19 MED ORDER — 0.9 % SODIUM CHLORIDE (POUR BTL) OPTIME
TOPICAL | Status: DC | PRN
  Administered 2016-02-19: 1000 mL

## 2016-02-19 MED ORDER — AMLODIPINE BESYLATE-VALSARTAN 10-160 MG PO TABS: 1.0000 | ORAL_TABLET | Freq: Every day | ORAL | Status: DC

## 2016-02-19 MED ORDER — VANCOMYCIN HCL IN DEXTROSE 1-5 GM/200ML-% IV SOLN
1000.0000 mg | Freq: Two times a day (BID) | INTRAVENOUS | Status: DC
  Administered 2016-02-20 (×2): 1000 mg via INTRAVENOUS
  Filled 2016-02-19 (×3): qty 200

## 2016-02-19 MED ORDER — PROPOFOL 10 MG/ML IV BOLUS
INTRAVENOUS | Status: AC
  Filled 2016-02-19: qty 20

## 2016-02-19 MED ORDER — ROCURONIUM BROMIDE 50 MG/5ML IV SOLN
INTRAVENOUS | Status: AC
  Filled 2016-02-19: qty 1

## 2016-02-19 MED ORDER — LACTATED RINGERS IV SOLN: INTRAVENOUS | Status: DC

## 2016-02-19 MED ORDER — METHOCARBAMOL 500 MG PO TABS: 500.0000 mg | ORAL_TABLET | Freq: Four times a day (QID) | ORAL | Status: DC | PRN

## 2016-02-19 MED ORDER — LACTATED RINGERS IV SOLN
INTRAVENOUS | Status: DC
  Administered 2016-02-19 (×2): via INTRAVENOUS

## 2016-02-19 MED ORDER — PROPOFOL 10 MG/ML IV BOLUS
INTRAVENOUS | Status: DC | PRN
  Administered 2016-02-19: 170 mg via INTRAVENOUS

## 2016-02-19 MED ORDER — MENTHOL 3 MG MT LOZG
1.0000 | LOZENGE | OROMUCOSAL | Status: DC | PRN
  Filled 2016-02-19: qty 9

## 2016-02-19 MED ORDER — ONDANSETRON HCL 4 MG/2ML IJ SOLN
4.0000 mg | INTRAMUSCULAR | Status: DC | PRN
  Administered 2016-02-19 – 2016-02-20 (×3): 4 mg via INTRAVENOUS
  Filled 2016-02-19 (×2): qty 2

## 2016-02-19 SURGICAL SUPPLY — 71 items
APL SKNCLS STERI-STRIP NONHPOA (GAUZE/BANDAGES/DRESSINGS) ×2
BANDAGE ACE 4X5 VEL STRL LF (GAUZE/BANDAGES/DRESSINGS) ×1 IMPLANT
BANDAGE ELASTIC 4 VELCRO ST LF (GAUZE/BANDAGES/DRESSINGS) ×1 IMPLANT
BENZOIN TINCTURE PRP APPL 2/3 (GAUZE/BANDAGES/DRESSINGS) ×3 IMPLANT
BIT DRILL SKYLINE 12MM (BIT) IMPLANT
BLADE SURG ROTATE 9660 (MISCELLANEOUS) IMPLANT
BNDG CMPR 9X4 STRL LF SNTH (GAUZE/BANDAGES/DRESSINGS) ×2
BNDG ESMARK 4X9 LF (GAUZE/BANDAGES/DRESSINGS) ×1 IMPLANT
BONE CERV LORDOTIC 14.5X12X6 (Bone Implant) ×3 IMPLANT
BONE CERV LORDOTIC 14.5X12X7 (Bone Implant) ×3 IMPLANT
BUR ROUND FLUTED 4 SOFT TCH (BURR) IMPLANT
CLSR STERI-STRIP ANTIMIC 1/2X4 (GAUZE/BANDAGES/DRESSINGS) ×1 IMPLANT
COLLAR CERV LO CONTOUR FIRM DE (SOFTGOODS) ×1 IMPLANT
CORDS BIPOLAR (ELECTRODE) ×3 IMPLANT
COVER SURGICAL LIGHT HANDLE (MISCELLANEOUS) ×3 IMPLANT
CRADLE DONUT ADULT HEAD (MISCELLANEOUS) ×3 IMPLANT
CUFF TOURNIQUET SINGLE 18IN (TOURNIQUET CUFF) IMPLANT
CUFF TOURNIQUET SINGLE 24IN (TOURNIQUET CUFF) IMPLANT
DRAPE C-ARM 42X72 X-RAY (DRAPES) ×3 IMPLANT
DRAPE MICROSCOPE LEICA (MISCELLANEOUS) ×3 IMPLANT
DRAPE PROXIMA HALF (DRAPES) ×3 IMPLANT
DRILL BIT SKYLINE 12MM (BIT) ×3
DURAPREP 26ML APPLICATOR (WOUND CARE) ×3 IMPLANT
DURAPREP 6ML APPLICATOR 50/CS (WOUND CARE) ×3 IMPLANT
ELECT COATED BLADE 2.86 ST (ELECTRODE) ×3 IMPLANT
ELECT REM PT RETURN 9FT ADLT (ELECTROSURGICAL) ×3
ELECTRODE REM PT RTRN 9FT ADLT (ELECTROSURGICAL) ×2 IMPLANT
EVACUATOR 1/8 PVC DRAIN (DRAIN) ×3 IMPLANT
GAUZE SPONGE 4X4 12PLY STRL (GAUZE/BANDAGES/DRESSINGS) ×4 IMPLANT
GAUZE XEROFORM 1X8 LF (GAUZE/BANDAGES/DRESSINGS) ×1 IMPLANT
GLOVE BIOGEL PI IND STRL 8 (GLOVE) ×4 IMPLANT
GLOVE BIOGEL PI INDICATOR 8 (GLOVE) ×2
GLOVE ORTHO TXT STRL SZ7.5 (GLOVE) ×6 IMPLANT
GOWN STRL REUS W/ TWL LRG LVL3 (GOWN DISPOSABLE) ×2 IMPLANT
GOWN STRL REUS W/ TWL XL LVL3 (GOWN DISPOSABLE) ×2 IMPLANT
GOWN STRL REUS W/TWL 2XL LVL3 (GOWN DISPOSABLE) ×3 IMPLANT
GOWN STRL REUS W/TWL LRG LVL3 (GOWN DISPOSABLE) ×6
GOWN STRL REUS W/TWL XL LVL3 (GOWN DISPOSABLE) ×3
GRAFT BNE SPCR VG2 14.5X12X6 (Bone Implant) IMPLANT
GRAFT BNE SPCR VG2 14.5X12X7 (Bone Implant) IMPLANT
HEAD HALTER (SOFTGOODS) ×3 IMPLANT
HEMOSTAT SURGICEL 2X14 (HEMOSTASIS) IMPLANT
KIT BASIN OR (CUSTOM PROCEDURE TRAY) ×3 IMPLANT
KIT ROOM TURNOVER OR (KITS) ×3 IMPLANT
MANIFOLD NEPTUNE II (INSTRUMENTS) ×3 IMPLANT
NDL 25GX 5/8IN NON SAFETY (NEEDLE) ×2 IMPLANT
NEEDLE 25GX 5/8IN NON SAFETY (NEEDLE) ×3 IMPLANT
NS IRRIG 1000ML POUR BTL (IV SOLUTION) ×3 IMPLANT
PACK ORTHO CERVICAL (CUSTOM PROCEDURE TRAY) ×3 IMPLANT
PACK ORTHO EXTREMITY (CUSTOM PROCEDURE TRAY) ×3 IMPLANT
PAD ARMBOARD 7.5X6 YLW CONV (MISCELLANEOUS) ×6 IMPLANT
PATTIES SURGICAL .5 X.5 (GAUZE/BANDAGES/DRESSINGS) ×1 IMPLANT
PIN TEMP SKYLINE THREADED (PIN) ×1 IMPLANT
PLATE TWO LEVEL SKYLINE 30MM (Plate) ×1 IMPLANT
RESTRAINT LIMB HOLDER UNIV (RESTRAINTS) IMPLANT
SCREW VARIABLE SELF TAP 12MM (Screw) ×6 IMPLANT
SPONGE GAUZE 4X4 12PLY STER LF (GAUZE/BANDAGES/DRESSINGS) ×2 IMPLANT
STRIP CLOSURE SKIN 1/2X4 (GAUZE/BANDAGES/DRESSINGS) ×3 IMPLANT
SUCTION FRAZIER HANDLE 10FR (MISCELLANEOUS)
SUCTION TUBE FRAZIER 10FR DISP (MISCELLANEOUS) IMPLANT
SURGIFLO W/THROMBIN 8M KIT (HEMOSTASIS) ×1 IMPLANT
SUT BONE WAX W31G (SUTURE) ×3 IMPLANT
SUT ETHILON 4 0 PS 2 18 (SUTURE) ×3 IMPLANT
SUT VIC AB 3-0 X1 27 (SUTURE) ×3 IMPLANT
SUT VICRYL 4-0 PS2 18IN ABS (SUTURE) ×6 IMPLANT
SYR 30ML SLIP (SYRINGE) ×3 IMPLANT
TOWEL OR 17X24 6PK STRL BLUE (TOWEL DISPOSABLE) ×3 IMPLANT
TOWEL OR 17X26 10 PK STRL BLUE (TOWEL DISPOSABLE) ×3 IMPLANT
TRAY FOLEY CATH 16FR SILVER (SET/KITS/TRAYS/PACK) IMPLANT
TUBE CONNECTING 12X1/4 (SUCTIONS) IMPLANT
WATER STERILE IRR 1000ML POUR (IV SOLUTION) ×2 IMPLANT

## 2016-02-19 NOTE — Anesthesia Preprocedure Evaluation (Signed)
Anesthesia Evaluation  Patient identified by MRN, date of birth, ID band Patient awake    Reviewed: Allergy & Precautions, NPO status , Patient's Chart, lab work & pertinent test results  History of Anesthesia Complications (+) PONV  Airway Mallampati: II   Neck ROM: full    Dental   Pulmonary neg pulmonary ROS,    breath sounds clear to auscultation       Cardiovascular hypertension,  Rhythm:regular Rate:Normal     Neuro/Psych  Headaches,    GI/Hepatic   Endo/Other  obese  Renal/GU      Musculoskeletal  (+) Arthritis ,   Abdominal   Peds  Hematology   Anesthesia Other Findings   Reproductive/Obstetrics                             Anesthesia Physical Anesthesia Plan  ASA: II  Anesthesia Plan: General   Post-op Pain Management:    Induction: Intravenous  Airway Management Planned: Oral ETT  Additional Equipment:   Intra-op Plan:   Post-operative Plan: Extubation in OR  Informed Consent: I have reviewed the patients History and Physical, chart, labs and discussed the procedure including the risks, benefits and alternatives for the proposed anesthesia with the patient or authorized representative who has indicated his/her understanding and acceptance.     Plan Discussed with: CRNA, Anesthesiologist and Surgeon  Anesthesia Plan Comments:         Anesthesia Quick Evaluation

## 2016-02-19 NOTE — H&P (Signed)
[name] is an 60 y.o. female.   The patient returns.  She continues to have problems with ongoing neck pain.  She has pain that shoots down her arm.  It is worse on the left hand than on the right.  She has been followed by Dr. 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  She denies any falling.  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  She has significant spondylosis above and below the C5-6 autofused level.  There is some subluxation at C3-4 on the AP x-ray.  She has 3 mm of motion at C4-5 above the fusion and a 5 mm shift at C3-4 which completely reduces with extension.  The C6-7 level is stable.  There is flattening of the ventral cord at C4-5 on MRI and mild narrowing at C6-7.   PAST MEDICAL HISTORY:  She takes Exforge for hypertension and also supplements.   ALLERGIES:  Keflex, Percocet and its derivatives, morphine, Demerol and codeine.    SOCIAL HISTORY:  The patient is a nonsmoker.  She does not drink.  She does not drink coffee.  Occasionally she does some walking for exercise.          Past Medical History  Diagnosis Date  . PONV (postoperative nausea and vomiting)   . Wears glasses   . Hypertension   . Prolonged QT interval   . History of pneumonia   . History of tuberculosis   . History of gallstones   . Headache     "believe it was gluten related"  . Arthritis   . Anemia   . Chronic pain   . Numbness and tingling     Past Surgical History  Procedure Laterality Date  . Dilatation & currettage/hysteroscopy with resectocope N/A 10/30/2013    Procedure: DILATATION & CURETTAGE/HYSTEROSCOPY WITH RESECTOCOPE;  Surgeon: Marvene Staff, MD;  Location: [facility];  Service: Gynecology;  Laterality: N/A;  1 hr.  . Cholecystectomy    . Colonoscopy    . Esophagogastroduodenoscopy      History reviewed. No pertinent family history. Social History:  reports that she has never smoked. She has never used smokeless  tobacco. She reports that she drinks alcohol. She reports that she does not use illicit drugs.  Allergies:  Allergies  Allergen Reactions  . Keflex [Cephalexin] Nausea And Vomiting and Other (See Comments)    Headaches Pt. Says she CAN tolerate Pcn & Amoxicillin  . Dairy Aid [Lactase] Rash  . Demerol [Meperidine] Nausea And Vomiting  . Gluten Meal Other (See Comments)    headaches  . Milk-Related Compounds Rash  . Morphine And Related Nausea And Vomiting and Other (See Comments)    headache  . Percocet [Oxycodone-Acetaminophen] Nausea And Vomiting    Medications Prior to Admission  Medication Sig Dispense Refill  . amLODipine-valsartan (EXFORGE) 10-160 MG tablet Take 1 tablet by mouth daily.  0  . GOLDEN SEAL PO Take 222 mg by mouth daily. Golden Seal liquid - 222mg /ml    . magnesium oxide (MAG-OX) 400 MG tablet Take 400 mg by mouth daily.     . Melatonin 10 MG TABS Take 10 mg by mouth at bedtime as needed (sleep).    Marland Kitchen OVER THE COUNTER MEDICATION Take 1 capsule by mouth daily. MegaFood BloodBuilder (includes food source iron)    . Potassium 99 MG TABS Take 99 mg by mouth at bedtime as needed (leg cramps).    . Turmeric POWD Take  10 mLs by mouth daily. Mix in 4 oz of water and drink    . ibuprofen (ADVIL,MOTRIN) 800 MG tablet Take 1 tablet (800 mg total) by mouth every 8 (eight) hours as needed. (Patient not taking: Reported on 02/10/2016) 30 tablet 0  . naproxen sodium (ALEVE) 220 MG tablet Take 880 mg by mouth daily as needed (pain).      No results found for this or any previous visit (from the past 48 hour(s)). No results found.  Review of Systems  Constitutional: Negative.   HENT: Negative.   Eyes: Negative.   Respiratory: Negative.   Cardiovascular: Negative.   Gastrointestinal: Negative.   Genitourinary: Negative.   Musculoskeletal: Positive for back pain.  Skin: Negative.   Neurological: Negative.   Psychiatric/Behavioral: Negative.     Blood pressure 128/87,  pulse 81, temperature 98.7 F (37.1 C), temperature source Oral, resp. rate 18, height 5' 2.25" (1.581 m), weight 85.775 kg (189 lb 1.6 oz), SpO2 96 %. Physical Exam  Constitutional: She is oriented to person, place, and time.  HENT:  Head: Atraumatic.  Eyes: EOM are normal.  Neck: Normal range of motion.  Cardiovascular: Normal rate.   Respiratory: No respiratory distress.  GI: She exhibits no distension.  Neurological: She is alert and oriented to person, place, and time.  Skin: Skin is warm and dry.  Psychiatric: She has a normal mood and affect.      PHYSICAL EXAMINATION:  The patient is alert and oriented.  Extraocular movements are intact.  There is no supraclavicular lymphadenopathy.  There is bilateral brachial plexus tenderness.  She has increased pain with compression.  There is more tenderness with Spurling on the left than on the right.  She has some relief with distraction.  There is tenderness over the coracoid on the left.  She has some sciatic notch tenderness and some pain with straight leg raising which is mild.  The EHL and anterior tib is intact.     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    PLAN:  We discussed options with her.  Surgical recommendations for cervical instability, myelomalacia and cord compression would be C3-4, C4-5 anterior cervical diskectomy and fusion, allograft and plate.  Right carpal tunnel release for carpal tunnel symptoms.  The cervical spine would be done first followed by the carpal tunnel release after the cervical fusion procedure.  We discussed the risks of surgery including dysphagia and dysphonia.  The risks of pseudoarthrosis posterior fusion if she did not heal.  She understands that she may have some residual symptoms due to the myelomalacia changes in her  cord.  The pain medication made her nauseated.  She may still have some nausea problems after the surgery.  She is allergic to Keflex so we will use clindamycin preoperatively.  All her questions were answered.  She understands and she wishes to proceed.   Lanae Crumbly, PA-C 02/19/2016, 11:59 AM

## 2016-02-19 NOTE — Brief Op Note (Signed)
02/19/2016  5:46 PM  PATIENT:  [name]  60 y.o. female  PRE-OPERATIVE DIAGNOSIS:  Cervical Spondylosis, Stenosis, Myelomalacia, Right Carpal Tunnel Syndrome  POST-OPERATIVE DIAGNOSIS:  Cervical Spondylosis, Stenosis, Myelomalacia, Right Carpal Tunnel Syndrome  PROCEDURE:  Procedure(s): C3-4, C4-5 Anteior Cervical Discectomy and Fusion, Allograft, Plate (N/A) Right Carpal Tunnel Release (Right)  SURGEON:  Surgeon(s) and Role:    Marybelle Killings, MD - Primary  PHYSICIAN ASSISTANT: Eyvette Cordon m. Jasha Hodzic pa-c    ANESTHESIA:   general  EBL:  Total I/O In: 1000 [I.V.:1000] Out: 25 [Blood:25]  BLOOD ADMINISTERED:none  DRAINS: hemovac   SPECIMEN:  No Specimen  DISPOSITION OF SPECIMEN:  N/A  COUNTS:  YES  TOURNIQUET:   Total Tourniquet Time Documented: Upper Arm (Right) - 6 minutes Total: Upper Arm (Right) - 6 minutes   DICTATION: .Viviann Spare Dictation  PLAN OF CARE: Admit for overnight observation  PATIENT DISPOSITION:  PACU - hemodynamically stable.  }

## 2016-02-19 NOTE — Op Note (Signed)
NAMEALISON, [name]           ACCOUNT NO.:  [id number]  MEDICAL RECORD NO.:  [id number]  LOCATION:  5N07C                        FACILITY:  [facility]  PHYSICIAN:  [name], M.D.    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  PROCEDURES:  C3-4, C4-5 anterior cervical diskectomy and fusion, allograft and plate.  Right carpal tunnel release.  SURGEON:  [name], M.D.  ASSISTANT:  Alyson Locket. Ricard Dillon, PA-C, medically necessary and present for the entire procedure.  EBL:  Minimal.  DRAINS:  One Hemovac neck.  TOURNIQUET TIME:  6 minutes, right upper extremity.  ANESTHESIA:  General plus Marcaine local, 6 mL.  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  She also has positive test for moderately severe carpal tunnel with failed conservative treatment.  DESCRIPTION OF PROCEDURE:  After induction of anesthesia, head halter traction, intubation, no weights applied to the traction, arms were tucked to the side with heel pads.  Neck was prepped with DuraPrep. Ancef was given prophylactically.  Area was squared with towels, sterile skin marker following the skin fold starting at the midline extending to the left, Betadine, Steri-Drape, sterile Mayo stand at the head, thyroid sheets and drapes.  Time-out procedure was completed.  A second time-out was done just prior to the right carpal tunnel to confirm the second portion.  Incision was started at the midline extending to the left. Platysma was divided in line with the skin  incision.  Blunt dissection, small vein was coagulated.  Longus colli was identified, carotid sheath and contents were lateral.  Large spurs noted at C4-5 and at the C4-5 level.  The patient had some myelomalacia or cord edema that was slightly to the right side where there was spurring a disk material. Short 25 needle was placed at C4-5, confirmed with lateral C-arm that had been sterilely draped.  Diskectomy was performed.  Marking and self- retaining Cloward retractors were placed, smooth blades up and down. Teeth blades, right and left.  Operative microscope was draped, brought in.  Diskectomy was performed removing the large anterior spurs progressing back to the posterior cortex.  There were thick chunks of ligament.  No extruded fragments, but a large amount of disk material retained by the ligament causing bulging and contributing to the spinal stenosis.  Overhanging spurs were removed.  There was about a 2-mm gap between C4 and C5 posteriorly at the cortex.  Once spurs were removed with 1 and 2-mm Kerrisons, complete decompression with full visualization of the dura right and left.  There was some epidural oozing and Surgiflo was placed.  After few minutes, this was removed, trial Sizer showed 7-mm gave tight fit.  It was countersunk 2 mm while CRNA pulled head halter traction and then released once it was countersunk appropriately.  Graft  was marked anteriorly vertical line to make sure there was no rotation of the graft with insertion.  Identical procedure was repeated at the C3-4 level.  Spurs removed anteriorly off the C3 and care was taken to make sure that the anterolisthesis was reduced as the plate was applied after decompression back to the dura, placement of the 6-mm graft again with traction being pulled and then placement of a 40-mm V-Loc plate.  12-mm screws were used x6 after a plate was confirmed with C-arm, AP and lateral, excellent position. Final spot pictures  were taken showing good position.  All screws were locked in with tiny screwdriver.  Hemovac was placed with in-and-out technique, in line with the skin incision on the left side.  Platysma closed with 3-0 Vicryl, 4-0 Vicryl subcuticular closure.  Tincture of benzoin, Steri-Strips, postop dressing and soft cervical collar.  The patient had [place] Tissue Bank grafts.  Skyline 30-mm DePuy Synthes spine plate was used and not 40-mm as mentioned above.  Once the collar was applied, the right arm was taken out of the swath, placed out on an armboard.  Proximal tourniquet was applied, set at 250.  Arm was prepped with DuraPrep.  Stockinette, extremity sheets and drapes, Esmarch wrapping after second time-out and tourniquet inflation. Incision was made.  Transverse carpal ligament was divided starting proximally, extending distally, running along the ulnar aspect of the median nerve.  There was hourglass deformity, hyperemia of the epineurium.  Palmar fat was visualized.  Tourniquet was deflated. Bipolar was used for hemostasis.  Finger tip was placed proximally and distally.  No areas of compression.  Skin was closed with 4-0 nylon. Xeroform, twiners, 4x4s and Ace wrap was applied for postoperative dressing.     [name], M.D.     MCY/MEDQ  D:  02/19/2016  T:  02/19/2016  Job:  [id number]

## 2016-02-19 NOTE — Progress Notes (Signed)
Orthopedic Tech Progress Note Patient Details:  [name] 02/20/1956 [id number]  Ortho Devices Type of Ortho Device: Soft collar Ortho Device/Splint Location: at bedside Ortho Device/Splint Interventions: Criss Alvine 02/19/2016, 5:03 PM

## 2016-02-19 NOTE — Transfer of Care (Signed)
Immediate Anesthesia Transfer of Care Note  Patient: [name]  Procedure(s) Performed: Procedure(s): C3-4, C4-5 Anteior Cervical Discectomy and Fusion, Allograft, Plate (N/A) Right Carpal Tunnel Release (Right)  Patient Location: PACU  Anesthesia Type:General  Level of Consciousness: awake, alert , oriented and patient cooperative  Airway & Oxygen Therapy: Patient Spontanous Breathing and Patient connected to nasal cannula oxygen  Post-op Assessment: Report given to RN, Post -op Vital signs reviewed and stable and Patient moving all extremities  Post vital signs: Reviewed and stable  Last Vitals:  Filed Vitals:   02/19/16 1049  BP: 128/87  Pulse: 81  Temp: 37.1 C  Resp: 18    Last Pain:  Filed Vitals:   02/19/16 1051  PainSc: 8       Patients Stated Pain Goal: 2 ([id number])  Complications: No apparent anesthesia complications

## 2016-02-19 NOTE — Anesthesia Procedure Notes (Signed)
Procedure Name: Intubation Date/Time: 02/19/2016 12:40 PM Performed by: Izora Gala Pre-anesthesia Checklist: Patient identified, Emergency Drugs available, Suction available and Patient being monitored Patient Re-evaluated:Patient Re-evaluated prior to inductionOxygen Delivery Method: Circle system utilized Preoxygenation: Pre-oxygenation with 100% oxygen Intubation Type: IV induction Ventilation: Mask ventilation without difficulty Laryngoscope Size: Miller and 3 Grade View: Grade I Tube type: Oral Tube size: 7.5 mm Number of attempts: 1 Airway Equipment and Method: Stylet and LTA kit utilized Placement Confirmation: ETT inserted through vocal cords under direct vision,  positive ETCO2,  CO2 detector and breath sounds checked- equal and bilateral Secured at: 22 cm Tube secured with: Tape Dental Injury: Teeth and Oropharynx as per pre-operative assessment

## 2016-02-19 NOTE — Interval H&P Note (Signed)
History and Physical Interval Note:  02/19/2016 12:22 PM  [name]  has presented today for surgery, with the diagnosis of Cervical Spondylosis, Stenosis, Myelomalacia, Right Carpal Tunnel Syndrome  The various methods of treatment have been discussed with the patient and family. After consideration of risks, benefits and other options for treatment, the patient has consented to  Procedure(s): C3-4, C4-5 Anteior Cervical Discectomy and Fusion, Allograft, Plate (N/A) Right Carpal Tunnel Release (Right) as a surgical intervention .  The patient's history has been reviewed, patient examined, no change in status, stable for surgery.  I have reviewed the patient's chart and labs.  Questions were answered to the patient's satisfaction.     Garfield Coiner C

## 2016-02-19 NOTE — Progress Notes (Signed)
Pharmacy Antibiotic Note  [name] is a 60 y.o. female  S/p spinal surgery wih a drain in place. Pharmacy has been consulted for vancomycin dosing for surgical prophylaxis. -Vancomycin 1000mg  given at 11am -SCr= 0.62 on 02/11/2016, CrCl ~ 80   Plan: -Vancomycin 1000mg  IV q12h while drain is in place -BMET in am -Will follow renal function and clinical progress   Height: 5' 2.25" (158.1 cm) Weight: 189 lb 1.6 oz (85.775 kg) IBW/kg (Calculated) : 50.68  Temp (24hrs), Avg:98.1 F (36.7 C), Min:97.7 F (36.5 C), Max:98.7 F (37.1 C)  No results for input(s): WBC, CREATININE, LATICACIDVEN, VANCOTROUGH, VANCOPEAK, VANCORANDOM, GENTTROUGH, GENTPEAK, GENTRANDOM, TOBRATROUGH, TOBRAPEAK, TOBRARND, AMIKACINPEAK, AMIKACINTROU, AMIKACIN in the last 168 hours.  Estimated Creatinine Clearance: 79.2 mL/min (by C-G formula based on Cr of 0.62).    Allergies  Allergen Reactions  . Keflex [Cephalexin] Nausea And Vomiting and Other (See Comments)    Headaches Pt. Says she CAN tolerate Pcn & Amoxicillin  . Dairy Aid [Lactase] Rash  . Demerol [Meperidine] Nausea And Vomiting  . Gluten Meal Other (See Comments)    headaches  . Milk-Related Compounds Rash  . Morphine And Related Nausea And Vomiting and Other (See Comments)    headache  . Percocet [Oxycodone-Acetaminophen] Nausea And Vomiting    Antimicrobials this admission: 6/14 vanc>>  Microbiology results: none  Thank you for allowing pharmacy to be a part of this patient's care.  Hildred Laser, Pharm D 02/19/2016 4:41 PM

## 2016-02-20 ENCOUNTER — Encounter (HOSPITAL_COMMUNITY): Payer: Self-pay | Admitting: General Practice

## 2016-02-20 DIAGNOSIS — M532X2 Spinal instabilities, cervical region: Secondary | ICD-10-CM | POA: Diagnosis not present

## 2016-02-20 DIAGNOSIS — M4722 Other spondylosis with radiculopathy, cervical region: Secondary | ICD-10-CM | POA: Diagnosis present

## 2016-02-20 LAB — BASIC METABOLIC PANEL
ANION GAP: 9 (ref 5–15)
BUN: 6 mg/dL (ref 6–20)
CALCIUM: 9.1 mg/dL (ref 8.9–10.3)
CO2: 25 mmol/L (ref 22–32)
Chloride: 104 mmol/L (ref 101–111)
Creatinine, Ser: 0.58 mg/dL (ref 0.44–1.00)
GLUCOSE: 119 mg/dL — AB (ref 65–99)
Potassium: 3.2 mmol/L — ABNORMAL LOW (ref 3.5–5.1)
Sodium: 138 mmol/L (ref 135–145)

## 2016-02-20 MED ORDER — METHOCARBAMOL 500 MG PO TABS: 500.0000 mg | ORAL_TABLET | Freq: Four times a day (QID) | ORAL | Status: DC | PRN

## 2016-02-20 MED ORDER — TRAMADOL HCL 50 MG PO TABS
50.0000 mg | ORAL_TABLET | Freq: Four times a day (QID) | ORAL | Status: DC | PRN
  Administered 2016-02-20: 50 mg via ORAL
  Filled 2016-02-20: qty 1

## 2016-02-20 MED ORDER — ONDANSETRON 4 MG PO TBDP: 4.0000 mg | ORAL_TABLET | Freq: Three times a day (TID) | ORAL | Status: DC | PRN

## 2016-02-20 MED ORDER — TRAMADOL HCL 50 MG PO TABS: 50.0000 mg | ORAL_TABLET | Freq: Four times a day (QID) | ORAL | Status: DC | PRN

## 2016-02-20 MED ORDER — OXYCODONE-ACETAMINOPHEN 5-325 MG PO TABS
1.0000 | ORAL_TABLET | Freq: Four times a day (QID) | ORAL | Status: DC | PRN
Start: 2016-02-20 — End: 2016-02-20

## 2016-02-20 NOTE — Discharge Instructions (Signed)
Keep collar on except when switching collar for shower. Cover right Hand with bag to keep right hand dressing dry. See Dr. Lorin Mercy in one week.

## 2016-02-20 NOTE — Progress Notes (Signed)
Pt ready for discharge. Dressing changed and education/instructions reviewed with pt and spouse; all questions/concerns addressed. IV removed and belongings gathered. Pt will be transported out via wheelchair to spouse's car. Will continue to monitor

## 2016-02-20 NOTE — Progress Notes (Signed)
Subjective: 1 Day Post-Op Procedure(s) (LRB): C3-4, C4-5 Anteior Cervical Discectomy and Fusion, Allograft, Plate (N/A) Right Carpal Tunnel Release (Right) Patient reports pain as mild and moderate.  " my right had is tingling and numb"  Objective: Vital signs in last 24 hours: Temp:  [97.7 F (36.5 C)-98.7 F (37.1 C)] 97.8 F (36.6 C) (06/15 0452) Pulse Rate:  [70-103] 78 (06/15 0452) Resp:  [13-18] 17 (06/15 0452) BP: (128-142)/(62-87) 134/62 mmHg (06/15 0452) SpO2:  [96 %-100 %] 97 % (06/15 0452) Weight:  [85.775 kg (189 lb 1.6 oz)] 85.775 kg (189 lb 1.6 oz) (06/14 1043)  Intake/Output from previous day: 06/14 0701 - 06/15 0700 In: 1000 [I.V.:1000] Out: 25 [Blood:25] Intake/Output this shift: Total I/O In: -  Out: 20 [Drains:20]  No results for input(s): HGB in the last 72 hours. No results for input(s): WBC, RBC, HCT, PLT in the last 72 hours. No results for input(s): NA, K, CL, CO2, BUN, CREATININE, GLUCOSE, CALCIUM in the last 72 hours. No results for input(s): LABPT, INR in the last 72 hours.  Neurologically intact  DI , FDP, FDS, WE,WF all intact. HV pulled.   Assessment/Plan: 1 Day Post-Op Procedure(s) (LRB): C3-4, C4-5 Anteior Cervical Discectomy and Fusion, Allograft, Plate (N/A) Right Carpal Tunnel Release (Right) Plan : discharge Home.  Office one week YATES,MARK C 02/20/2016, 7:55 AM

## 2016-02-24 ENCOUNTER — Encounter (HOSPITAL_COMMUNITY): Payer: Self-pay | Admitting: Orthopaedic Surgery

## 2016-02-25 NOTE — Anesthesia Postprocedure Evaluation (Signed)
Anesthesia Post Note  Patient: [name]  Procedure(s) Performed: Procedure(s) (LRB): C3-4, C4-5 Anteior Cervical Discectomy and Fusion, Allograft, Plate (N/A) Right Carpal Tunnel Release (Right)  Patient location during evaluation: PACU Anesthesia Type: General Level of consciousness: awake and alert and patient cooperative Pain management: pain level controlled Vital Signs Assessment: post-procedure vital signs reviewed and stable Respiratory status: spontaneous breathing and respiratory function stable Cardiovascular status: stable Anesthetic complications: no    Last Vitals:  Filed Vitals:   02/20/16 0002 02/20/16 0452  BP: 138/74 134/62  Pulse: 98 78  Temp: 36.7 C 36.6 C  Resp: 17 17    Last Pain:  Filed Vitals:   02/20/16 1137  PainSc: [place]

## 2016-03-11 NOTE — Discharge Summary (Signed)
Patient ID: [name] MRN: [id number] DOB/AGE: 15-Sep-1955 60 y.o.  Admit date: 02/19/2016 Discharge date: 03/11/2016  Admission Diagnoses:  Active Problems:   S/P carpal tunnel release   Cervical spondylosis with radiculopathy   Discharge Diagnoses:  Active Problems:   S/P carpal tunnel release   Cervical spondylosis with radiculopathy  status post Procedure(s): C3-4, C4-5 Anteior Cervical Discectomy and Fusion, Allograft, Plate Right Carpal Tunnel Release  Past Medical History  Diagnosis Date  . PONV (postoperative nausea and vomiting)   . Wears glasses   . Hypertension   . Prolonged QT interval   . History of pneumonia   . History of tuberculosis   . History of gallstones   . Headache     "believe it was gluten related"  . Arthritis   . Anemia   . Chronic pain   . Numbness and tingling     Surgeries: Procedure(s): C3-4, C4-5 Anteior Cervical Discectomy and Fusion, Allograft, Plate Right Carpal Tunnel Release on 02/19/2016   Consultants:    Discharged Condition: Improved  Hospital Course: [name] is an 60 y.o. female who was admitted 02/19/2016 for operative treatment of cervical stenosis and right carpal tunnel syndrome. Patient failed conservative treatments (please see the history and physical for the specifics) and had severe unremitting pain that affects sleep, daily activities and work/hobbies. After pre-op clearance, the patient was taken to the operating room on 02/19/2016 and underwent  Procedure(s): C3-4, C4-5 Anteior Cervical Discectomy and Fusion, Allograft, Plate Right Carpal Tunnel Release.    Patient was given perioperative antibiotics:  Anti-infectives    Start     Dose/Rate Route Frequency Ordered Stop   02/19/16 2300  vancomycin (VANCOCIN) IVPB 1000 mg/200 mL premix  Status:  Discontinued     1,000 mg 200 mL/hr over 60 Minutes Intravenous Every 12 hours 02/19/16 1643 02/20/16 1103   02/19/16 1200  vancomycin (VANCOCIN) IVPB  1000 mg/200 mL premix     1,000 mg 200 mL/hr over 60 Minutes Intravenous To ShortStay Surgical 02/18/16 1109 02/19/16 1200       Patient was given sequential compression devices and early ambulation to prevent DVT.   Patient benefited maximally from hospital stay and there were no complications. At the time of discharge, the patient was urinating/moving their bowels without difficulty, tolerating a regular diet, pain is controlled with oral pain medications and they have been cleared by PT/OT.   Recent vital signs: No data found.    Recent laboratory studies: No results for input(s): WBC, HGB, HCT, PLT, NA, K, CL, CO2, BUN, CREATININE, GLUCOSE, INR, CALCIUM in the last 72 hours.  Invalid input(s): PT, 2   Discharge Medications:     Medication List    STOP taking these medications        ALEVE 220 MG tablet  Generic drug:  naproxen sodium     ibuprofen 800 MG tablet  Commonly known as:  ADVIL,MOTRIN     OVER THE COUNTER MEDICATION     Turmeric Powd      TAKE these medications        amLODipine-valsartan 10-160 MG tablet  Commonly known as:  EXFORGE  Take 1 tablet by mouth daily.     GOLDEN SEAL PO  Take 222 mg by mouth daily. Golden Seal liquid - 222mg /ml     magnesium oxide 400 MG tablet  Commonly known as:  MAG-OX  Take 400 mg by mouth daily.     Melatonin 10 MG Tabs  Take 10  mg by mouth at bedtime as needed (sleep).     methocarbamol 500 MG tablet  Commonly known as:  ROBAXIN  Take 1 tablet (500 mg total) by mouth every 6 (six) hours as needed for muscle spasms.     ondansetron 4 MG disintegrating tablet  Commonly known as:  ZOFRAN ODT  Take 1 tablet (4 mg total) by mouth every 8 (eight) hours as needed.     Potassium 99 MG Tabs  Take 99 mg by mouth at bedtime as needed (leg cramps).     traMADol 50 MG tablet  Commonly known as:  ULTRAM  Take 1 tablet (50 mg total) by mouth every 6 (six) hours as needed for moderate pain.        Diagnostic  Studies: Dg Chest 2 View  02/11/2016  CLINICAL DATA:  Preoperative evaluation for neck surgery EXAM: CHEST  2 VIEW COMPARISON:  None. FINDINGS: Lungs are clear. Heart size and pulmonary vascularity are normal. No adenopathy. No bone lesions. 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 There are surgical clips in the upper abdomen. IMPRESSION: No edema or consolidation. Electronically Signed   By: Lowella Grip III M.D.   On: 02/11/2016 09:21   Dg Cervical Spine 2-3 Views  02/19/2016  CLINICAL DATA:  C3-4/4-5 ACDF. EXAM: DG C-ARM 61-120 MIN; CERVICAL SPINE - 2-3 VIEW COMPARISON:  11/05/2015 cervical spine MRI. FINDINGS: Fluoroscopy time 14 seconds. Two spot fluoroscopic nondiagnostic intraoperative cervical spine radiographs demonstrate postsurgical changes from ACDF C3-C5. Oral route tube enters the hypopharynx. IMPRESSION: Intraoperative fluoroscopic guidance for ACDF C3-C5. Electronically Signed   By: Ilona Sorrel M.D.   On: 02/19/2016 17:48   Dg C-arm 1-60 Min  02/19/2016  CLINICAL DATA:  C3-4/4-5 ACDF. EXAM: DG C-ARM 61-120 MIN; CERVICAL SPINE - 2-3 VIEW COMPARISON:  11/05/2015 cervical spine MRI. FINDINGS: Fluoroscopy time 14 seconds. Two spot fluoroscopic nondiagnostic intraoperative cervical spine radiographs demonstrate postsurgical changes from ACDF C3-C5. Oral route tube enters the hypopharynx. IMPRESSION: Intraoperative fluoroscopic guidance for ACDF C3-C5. Electronically Signed   By: Ilona Sorrel M.D.   On: 02/19/2016 17:48          Follow-up Information    Schedule an appointment as soon as possible for a visit with Anna Killings, MD.   Specialty:  Orthopedic Surgery   Why:  need return office visit one week postop   Contact information:   [address] [place] [address] [phone]       Discharge Plan:  discharge to home  Disposition:     Signed: Lanae Crumbly  03/11/2016, 9:22  AM

## 2016-06-10 ENCOUNTER — Ambulatory Visit (INDEPENDENT_AMBULATORY_CARE_PROVIDER_SITE_OTHER): Payer: BLUE CROSS/BLUE SHIELD | Admitting: Orthopaedic Surgery

## 2016-06-10 DIAGNOSIS — S62645D Nondisplaced fracture of proximal phalanx of left ring finger, subsequent encounter for fracture with routine healing: Secondary | ICD-10-CM | POA: Diagnosis not present

## 2016-06-26 ENCOUNTER — Ambulatory Visit (INDEPENDENT_AMBULATORY_CARE_PROVIDER_SITE_OTHER): Payer: BLUE CROSS/BLUE SHIELD | Admitting: Orthopaedic Surgery

## 2016-06-26 DIAGNOSIS — S62645D Nondisplaced fracture of proximal phalanx of left ring finger, subsequent encounter for fracture with routine healing: Secondary | ICD-10-CM | POA: Diagnosis not present

## 2016-06-26 DIAGNOSIS — S62646D Nondisplaced fracture of proximal phalanx of right little finger, subsequent encounter for fracture with routine healing: Secondary | ICD-10-CM

## 2016-07-28 ENCOUNTER — Encounter (INDEPENDENT_AMBULATORY_CARE_PROVIDER_SITE_OTHER): Payer: Self-pay | Admitting: Orthopaedic Surgery

## 2016-07-28 ENCOUNTER — Ambulatory Visit (INDEPENDENT_AMBULATORY_CARE_PROVIDER_SITE_OTHER): Payer: Self-pay

## 2016-07-28 ENCOUNTER — Ambulatory Visit (INDEPENDENT_AMBULATORY_CARE_PROVIDER_SITE_OTHER): Payer: BLUE CROSS/BLUE SHIELD | Admitting: Orthopaedic Surgery

## 2016-07-28 VITALS — BP 139/88 | HR 95 | Ht 64.0 in | Wt 196.0 lb

## 2016-07-28 DIAGNOSIS — S62619S Displaced fracture of proximal phalanx of unspecified finger, sequela: Secondary | ICD-10-CM | POA: Diagnosis not present

## 2016-07-28 DIAGNOSIS — M79642 Pain in left hand: Secondary | ICD-10-CM | POA: Diagnosis not present

## 2016-07-28 NOTE — Progress Notes (Signed)
Office Visit Note   Patient: [name]           Date of Birth: 04-16-1956           MRN: [id number] Visit Date: 07/28/2016              Requested by: Lucianne Lei, MD [address] [place], [place] [address] PCP: Elyn Peers, MD   Assessment & Plan: Visit Diagnoses:  1. Pain in left hand   2. Closed displaced fracture of proximal phalanx of finger, sequela            Left fourth and fifth proximal phalanx fracture date of injury 05/15/2016  Plan: We will refer to hand rehabilitation for hand therapy to work on extension of the small finger PIP joint, long finger PIP joint and finger flexion. 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 Follow-Up Instructions: Return in about 1 month (around 08/27/2016).   Orders:  Orders Placed This Encounter  Procedures  . XR Hand Complete Left   No orders of the defined types were placed in this encounter.     Procedures: No procedures performed   Clinical Data: No additional findings.   Subjective: Chief Complaint  Patient presents with  . Left Hand - Fracture, Follow-up    Patient is back for four week follow up left hand fractures.  Initial date of injury is 05/15/2016.  She is still unable to straighten 3 and 5 fingers. The 3rd finger locks up and she has to pull it. The 5th finger "does not want to cooperate with anything".  She is having pain. She has looked up physical therapy exercises online to try and work on that. She is not taking anything for pain.    Review of Systems  Constitutional: Negative for chills and diaphoresis.  HENT: Negative for ear discharge, ear pain and nosebleeds.   Eyes: Negative for discharge and visual disturbance.  Respiratory: Negative for cough, choking and shortness of breath.   Cardiovascular: Negative for chest  pain and palpitations.  Gastrointestinal: Negative for abdominal distention and abdominal pain.  Endocrine: Negative for cold intolerance and heat intolerance.  Genitourinary: Negative for flank pain and hematuria.  Musculoskeletal:       Positive for fall September 2017 with finger fractures. Cervical spine fusion postop doing well  Skin: Negative for rash and wound.  Neurological: Negative for seizures and speech difficulty.  Hematological: Negative for adenopathy. Does not bruise/bleed easily.  Psychiatric/Behavioral: Negative for agitation and suicidal ideas.     Objective: Vital Signs: BP 139/88   Pulse 95   Ht 5\' 4"  (1.626 m)   Wt 196 lb (88.9 kg)   BMI 33.64 kg/m   Physical Exam  Constitutional: She is oriented to person, place, and time. She appears well-developed.  HENT:  Head: Normocephalic.  Right Ear: External ear normal.  Left Ear: External ear normal.  Eyes: Pupils are equal, round, and reactive to light.  Neck: No tracheal deviation present. No thyromegaly present.  Cardiovascular: Normal rate.   Pulmonary/Chest: Effort normal.  Abdominal: Soft.  Neurological: She is alert and oriented to person, place, and time.  Skin: Skin is warm and dry.  Psychiatric: She has a normal mood and affect. Her behavior is normal.    Ortho Exam well-healed anterior cervical incision. Negative  Spurling no brachioplexus tenderness. Patient has a 30 flexion at the PIP joint left fifth finger. No tenderness of proximal phalanx at the fracture site. Long finger lacks 10 extension of the PIP joint. She lacks 2 cm touching fingertip to distal palmar crease. Reflexes are normal skin is intact no tenderness over the A1 pulley. Profundus abnormal my strength is good  Specialty Comments:  No specialty comments available.  Imaging: Xr Hand Complete Left  Result Date: 07/28/2016 Three-view x-rays obtain left hand AP lateral and oblique. This shows healed fracture of the base of the  fifth and fourth proximal phalanx. No joint subluxation Impression healed left fourth and fifth proximal phalanx fractures    PMFS History: Patient Active Problem List   Diagnosis Date Noted  . Closed displaced fracture of proximal phalanx of finger, sequela 07/28/2016  . Cervical spondylosis with radiculopathy 02/20/2016  . S/P carpal tunnel release 02/19/2016  . Plantar fasciitis, bilateral 03/17/2013  . Deformity of metatarsal 03/17/2013   Past Medical History:  Diagnosis Date  . Anemia   . Arthritis   . Chronic pain   . Headache    "believe it was gluten related"  . History of gallstones   . History of pneumonia   . History of tuberculosis   . Hypertension   . Numbness and tingling   . PONV (postoperative nausea and vomiting)   . Prolonged QT interval   . Wears glasses     No family history on file.  Past Surgical History:  Procedure Laterality Date  . ANTERIOR CERVICAL DECOMP/DISCECTOMY FUSION  02/2016  . ANTERIOR CERVICAL DECOMP/DISCECTOMY FUSION N/A 02/19/2016   Procedure: C3-4, C4-5 Anteior Cervical Discectomy and Fusion, Allograft, Plate;  Surgeon: Marybelle Killings, MD;  Location: [facility];  Service: Orthopedics;  Laterality: N/A;  . CARPAL TUNNEL RELEASE Right 02/19/2016   Procedure: Right Carpal Tunnel Release;  Surgeon: Marybelle Killings, MD;  Location: [facility];  Service: Orthopedics;  Laterality: Right;  . CHOLECYSTECTOMY    . COLONOSCOPY    . DILATATION & CURRETTAGE/HYSTEROSCOPY WITH RESECTOCOPE N/A 10/30/2013   Procedure: DILATATION & CURETTAGE/HYSTEROSCOPY WITH RESECTOCOPE;  Surgeon: Marvene Staff, MD;  Location: [facility];  Service: Gynecology;  Laterality: N/A;  1 hr.  . ESOPHAGOGASTRODUODENOSCOPY     Social History   Occupational History  . Not on file.   Social History Main Topics  . Smoking status: Never Smoker  . Smokeless tobacco: Never Used  . Alcohol use Yes     Comment: rarely  . Drug use: No  . Sexual activity: Not on file

## 2016-08-25 IMAGING — RF DG CERVICAL SPINE 2 OR 3 VIEWS
1 series · 2 of 2 positions shown · non-contrast
Comparison: 11/05/2015 cervical spine MRI.

CLINICAL DATA: C3-4/4-5 ACDF.

EXAM:
DG C-ARM 61-120 MIN; CERVICAL SPINE - 2-3 VIEW

[Series 1: run · 2 of 2 slices shown]
[im 1/2]
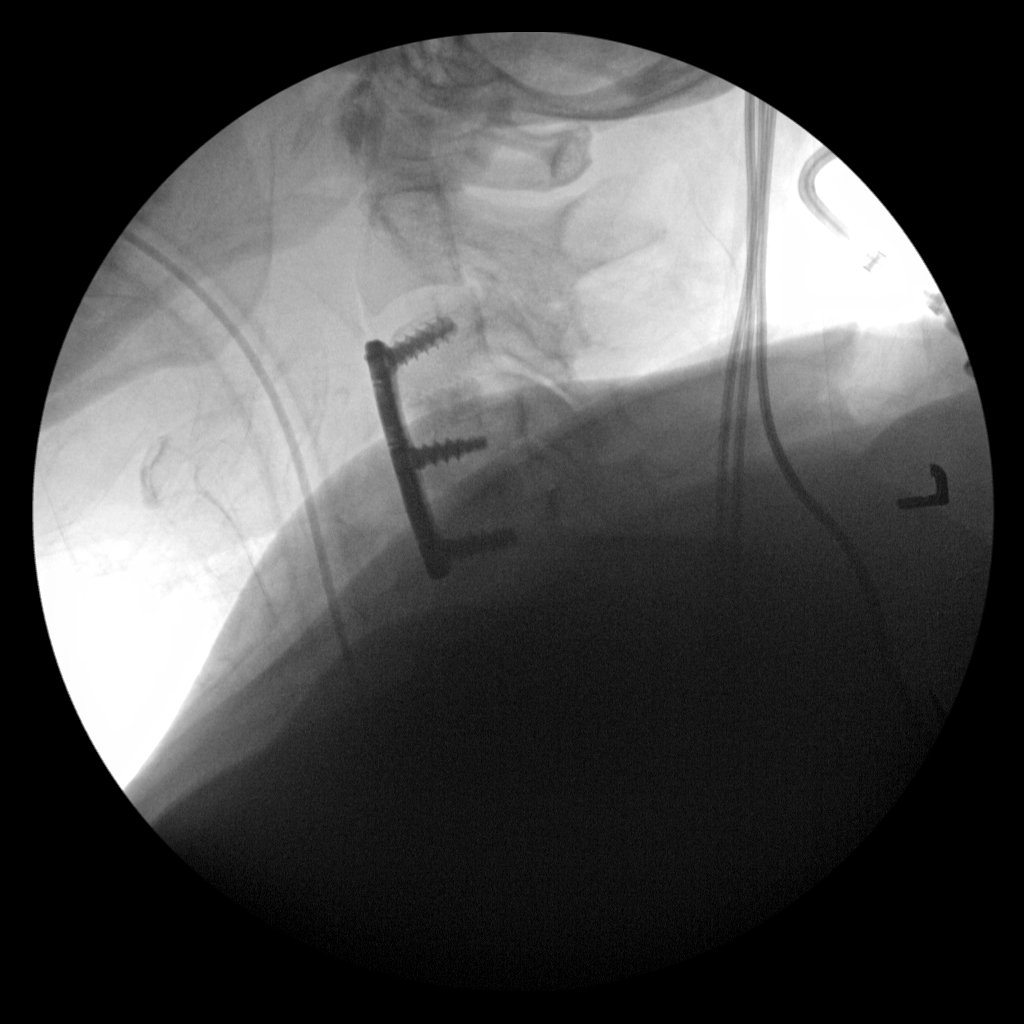
[im 2/2]
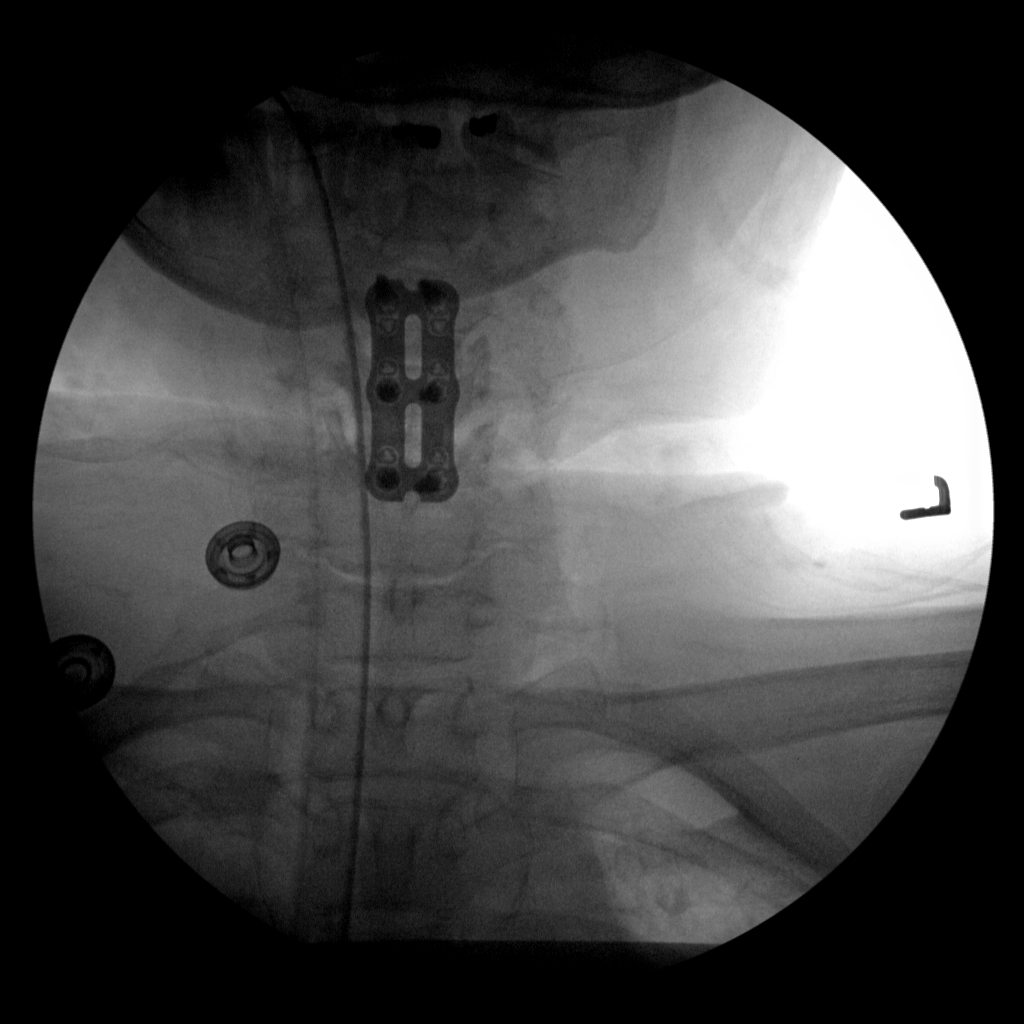

[2 of 2 positions shown; findings below may reference images not displayed]

FINDINGS: Fluoroscopy time 14 seconds. Two spot fluoroscopic nondiagnostic
intraoperative cervical spine radiographs demonstrate postsurgical
changes from ACDF C3-C5. Oral route tube enters the hypopharynx.
IMPRESSION: Intraoperative fluoroscopic guidance for ACDF C3-C5.

## 2016-08-26 ENCOUNTER — Ambulatory Visit (INDEPENDENT_AMBULATORY_CARE_PROVIDER_SITE_OTHER): Payer: BLUE CROSS/BLUE SHIELD | Admitting: Orthopaedic Surgery

## 2016-08-26 ENCOUNTER — Encounter (INDEPENDENT_AMBULATORY_CARE_PROVIDER_SITE_OTHER): Payer: Self-pay | Admitting: Orthopaedic Surgery

## 2016-08-26 VITALS — BP 127/88 | HR 111 | Ht 64.0 in | Wt 196.0 lb

## 2016-08-26 DIAGNOSIS — S62619S Displaced fracture of proximal phalanx of unspecified finger, sequela: Secondary | ICD-10-CM | POA: Diagnosis not present

## 2016-08-26 NOTE — Progress Notes (Signed)
Office Visit Note   Patient: [name]           Date of Birth: [birth year]           MRN: [id number] Visit Date: 08/26/2016              Requested by: Lucianne Lei, MD [address] [place], [place] [address] PCP: Elyn Peers, MD   Assessment & Plan: Visit Diagnoses:  1. Closed displaced fracture of proximal phalanx of finger, sequela     Plan: Patient will continue  hand therapy. We will recheck her in one month. We gave her some additional exercises to work on for flexion. We discussed importance of keeping her wrist in extended position to help with the use of finger flexions. As she does her therapy at home.  Follow-Up Instructions: No Follow-up on file.   Orders:  No orders of the defined types were placed in this encounter.  No orders of the defined types were placed in this encounter.     Procedures: No procedures performed   Clinical Data: No additional findings.   Subjective: Chief Complaint  Patient presents with  . Left Hand - Fracture, Follow-up    Patient returns for one month return follow up left hand fracture.  She has been going to physical therapy and believes that it is helping. She is still having some soreness and swelling. She has taken Naproxen and Robaxin, more for fibromyalgia pain with relief. She is wearing splint on her left 5th finger.    Review of Systems 14 point review of system unchanged from 07/28/2016 visit   Objective: Vital Signs: BP 127/88   Pulse (!) 111   Ht 5\' 4"  (1.626 m)   Wt 196 lb (88.9 kg)   BMI 33.64 kg/m   Physical Exam  Constitutional: She is oriented to person, place, and time. She appears well-developed.  HENT:  Head: Normocephalic.  Right Ear: External ear normal.  Left Ear: External ear normal.  Eyes: Pupils are equal, round, and reactive to light.  Neck: No tracheal deviation present. No thyromegaly present.  Cardiovascular: Normal rate.   Pulmonary/Chest: Effort normal.  Abdominal:  Soft.  Musculoskeletal:  Well-healed anterior cervical incision from previous procedure. No brachial plexus tenderness normal reflexes.  Neurological: She is alert and oriented to person, place, and time.  Skin: Skin is warm and dry.  Psychiatric: She has a normal mood and affect. Her behavior is normal.    Ortho Exam patient has improved range of motion of the hand. Left PIP is at 60/80 and has progressed to 25/85. She has an extension splint for the PIP joint small finger. Improved flexion of the index finger almost normal long and ring finger lags slightly behind she lacks about 1-2 cm distal palmar crease. Sedation fingertip is intact no angular or rotational deformity.  Specialty Comments:  No specialty comments available.  Imaging: No results found.   PMFS History: Patient Active Problem List   Diagnosis Date Noted  . Closed displaced fracture of proximal phalanx of finger, sequela 07/28/2016  . Cervical spondylosis with radiculopathy 02/20/2016  . S/P carpal tunnel release 02/19/2016  . Plantar fasciitis, bilateral 03/17/2013  . Deformity of metatarsal 03/17/2013   Past Medical History:  Diagnosis Date  . Anemia   . Arthritis   . Chronic pain   . Headache    "believe it was gluten related"  . History of gallstones   . History of pneumonia   .  History of tuberculosis   . Hypertension   . Numbness and tingling   . PONV (postoperative nausea and vomiting)   . Prolonged QT interval   . Wears glasses     No family history on file.  Past Surgical History:  Procedure Laterality Date  . ANTERIOR CERVICAL DECOMP/DISCECTOMY FUSION  02/2016  . ANTERIOR CERVICAL DECOMP/DISCECTOMY FUSION N/A 02/19/2016   Procedure: C3-4, C4-5 Anteior Cervical Discectomy and Fusion, Allograft, Plate;  Surgeon: Marybelle Killings, MD;  Location: [facility];  Service: Orthopedics;  Laterality: N/A;  . CARPAL TUNNEL RELEASE Right 02/19/2016   Procedure: Right Carpal Tunnel Release;  Surgeon: Marybelle Killings,  MD;  Location: [facility];  Service: Orthopedics;  Laterality: Right;  . CHOLECYSTECTOMY    . COLONOSCOPY    . DILATATION & CURRETTAGE/HYSTEROSCOPY WITH RESECTOCOPE N/A 10/30/2013   Procedure: DILATATION & CURETTAGE/HYSTEROSCOPY WITH RESECTOCOPE;  Surgeon: Marvene Staff, MD;  Location: [facility];  Service: Gynecology;  Laterality: N/A;  1 hr.  . ESOPHAGOGASTRODUODENOSCOPY     Social History   Occupational History  . Not on file.   Social History Main Topics  . Smoking status: Never Smoker  . Smokeless tobacco: Never Used  . Alcohol use Yes     Comment: rarely  . Drug use: No  . Sexual activity: Not on file

## 2016-08-26 NOTE — Patient Instructions (Signed)
Continue work on finger range of motion at home. Continue hand therapy. See Dr. Lorin Mercy in one month.

## 2016-09-29 ENCOUNTER — Ambulatory Visit (INDEPENDENT_AMBULATORY_CARE_PROVIDER_SITE_OTHER): Payer: BLUE CROSS/BLUE SHIELD | Admitting: Orthopaedic Surgery

## 2019-11-04 ENCOUNTER — Ambulatory Visit: Payer: Self-pay | Attending: Internal Medicine

## 2019-11-04 DIAGNOSIS — Z23 Encounter for immunization: Secondary | ICD-10-CM | POA: Insufficient documentation

## 2019-11-04 NOTE — Progress Notes (Signed)
   Covid-19 Vaccination Clinic  Name:  [name]    MRN: [id number] DOB: 1955/11/20  11/04/2019  [name] was observed post Covid-19 immunization for 30 minutes based on pre-vaccination screening without incidence. She was provided with Vaccine Information Sheet and instruction to access the V-Safe system.   [name] was instructed to call 911 with any severe reactions post vaccine: [place] Kitchen Difficulty breathing  . Swelling of your face and throat  . A fast heartbeat  . A bad rash all over your body  . Dizziness and weakness    Immunizations Administered    Name Date Dose VIS Date Route   Pfizer COVID-19 Vaccine 11/04/2019  5:18 PM 0.3 mL 08/18/2019 Intramuscular   Manufacturer: [place]   Lot: [id number]   [place]: [id number]

## 2019-11-25 ENCOUNTER — Ambulatory Visit: Payer: Self-pay | Attending: Internal Medicine

## 2019-11-25 ENCOUNTER — Other Ambulatory Visit: Payer: Self-pay

## 2019-11-25 DIAGNOSIS — Z23 Encounter for immunization: Secondary | ICD-10-CM

## 2019-11-25 NOTE — Progress Notes (Signed)
   Covid-19 Vaccination Clinic  Name:  [name]    MRN: [id number] DOB: February 13, 1956  11/25/2019  [name] was observed post Covid-19 immunization for 15 minutes without incident. She was provided with Vaccine Information Sheet and instruction to access the V-Safe system.   [name] was instructed to call 911 with any severe reactions post vaccine: Marland Kitchen Difficulty breathing  . Swelling of face and throat  . A fast heartbeat  . A bad rash all over body  . Dizziness and weakness   Immunizations Administered    Name Date Dose VIS Date Route   Pfizer COVID-19 Vaccine 11/25/2019  2:10 PM 0.3 mL 08/18/2019 Intramuscular   Manufacturer: [place]   Lot: [id number]   [place]: [id number]

## 2023-12-29 ENCOUNTER — Emergency Department (HOSPITAL_BASED_OUTPATIENT_CLINIC_OR_DEPARTMENT_OTHER): Payer: Worker's Compensation

## 2023-12-29 ENCOUNTER — Encounter (HOSPITAL_BASED_OUTPATIENT_CLINIC_OR_DEPARTMENT_OTHER): Payer: Self-pay | Admitting: *Deleted

## 2023-12-29 ENCOUNTER — Inpatient Hospital Stay (HOSPITAL_BASED_OUTPATIENT_CLINIC_OR_DEPARTMENT_OTHER)
Admission: EM | Admit: 2023-12-29 | Discharge: 2024-01-01 | DRG: 494 | Disposition: A | Discharge status: Home / Self Care | Payer: Worker's Compensation | Attending: Internal Medicine | Admitting: Internal Medicine

## 2023-12-29 ENCOUNTER — Other Ambulatory Visit: Payer: Self-pay

## 2023-12-29 DIAGNOSIS — Y990 Civilian activity done for income or pay: Secondary | ICD-10-CM

## 2023-12-29 DIAGNOSIS — S82141A Displaced bicondylar fracture of right tibia, initial encounter for closed fracture: Secondary | ICD-10-CM | POA: Diagnosis not present

## 2023-12-29 DIAGNOSIS — I10 Essential (primary) hypertension: Secondary | ICD-10-CM | POA: Diagnosis present

## 2023-12-29 DIAGNOSIS — W010XXA Fall on same level from slipping, tripping and stumbling without subsequent striking against object, initial encounter: Secondary | ICD-10-CM | POA: Diagnosis present

## 2023-12-29 DIAGNOSIS — S82121A Displaced fracture of lateral condyle of right tibia, initial encounter for closed fracture: Secondary | ICD-10-CM | POA: Insufficient documentation

## 2023-12-29 DIAGNOSIS — M1990 Unspecified osteoarthritis, unspecified site: Secondary | ICD-10-CM | POA: Diagnosis present

## 2023-12-29 DIAGNOSIS — R519 Headache, unspecified: Secondary | ICD-10-CM | POA: Diagnosis not present

## 2023-12-29 DIAGNOSIS — S82209A Unspecified fracture of shaft of unspecified tibia, initial encounter for closed fracture: Secondary | ICD-10-CM | POA: Diagnosis present

## 2023-12-29 DIAGNOSIS — Z8701 Personal history of pneumonia (recurrent): Secondary | ICD-10-CM

## 2023-12-29 DIAGNOSIS — Z91018 Allergy to other foods: Secondary | ICD-10-CM

## 2023-12-29 DIAGNOSIS — M549 Dorsalgia, unspecified: Secondary | ICD-10-CM | POA: Diagnosis present

## 2023-12-29 DIAGNOSIS — Y92000 Kitchen of unspecified non-institutional (private) residence as  the place of occurrence of the external cause: Secondary | ICD-10-CM

## 2023-12-29 DIAGNOSIS — M25561 Pain in right knee: Secondary | ICD-10-CM | POA: Diagnosis not present

## 2023-12-29 DIAGNOSIS — Z79899 Other long term (current) drug therapy: Secondary | ICD-10-CM

## 2023-12-29 DIAGNOSIS — Z881 Allergy status to other antibiotic agents status: Secondary | ICD-10-CM

## 2023-12-29 DIAGNOSIS — Z885 Allergy status to narcotic agent status: Secondary | ICD-10-CM

## 2023-12-29 DIAGNOSIS — R110 Nausea: Secondary | ICD-10-CM | POA: Diagnosis not present

## 2023-12-29 DIAGNOSIS — G894 Chronic pain syndrome: Secondary | ICD-10-CM | POA: Diagnosis present

## 2023-12-29 DIAGNOSIS — Z8611 Personal history of tuberculosis: Secondary | ICD-10-CM

## 2023-12-29 DIAGNOSIS — E876 Hypokalemia: Secondary | ICD-10-CM | POA: Diagnosis present

## 2023-12-29 DIAGNOSIS — Z91011 Allergy to milk products: Secondary | ICD-10-CM

## 2023-12-29 DIAGNOSIS — R9431 Abnormal electrocardiogram [ECG] [EKG]: Secondary | ICD-10-CM | POA: Diagnosis present

## 2023-12-29 HISTORY — DX: Myoneural disorder, unspecified: G70.9

## 2023-12-29 LAB — BASIC METABOLIC PANEL WITH GFR
Anion gap: 12 (ref 5–15)
BUN: 10 mg/dL (ref 8–23)
CO2: 26 mmol/L (ref 22–32)
Calcium: 9.7 mg/dL (ref 8.9–10.3)
Chloride: 104 mmol/L (ref 98–111)
Creatinine, Ser: 0.54 mg/dL (ref 0.44–1.00)
GFR, Estimated: >60 mL/min
Glucose, Bld: 87 mg/dL (ref 70–99)
Potassium: 3.7 mmol/L (ref 3.5–5.1)
Sodium: 142 mmol/L (ref 135–145)

## 2023-12-29 LAB — CBC WITH DIFFERENTIAL/PLATELET
Abs Immature Granulocytes: 0.02 K/uL (ref 0.00–0.07)
Basophils Absolute: 0 K/uL (ref 0.0–0.1)
Basophils Relative: 0 %
Eosinophils Absolute: 0.3 K/uL (ref 0.0–0.5)
Eosinophils Relative: 4 %
HCT: 41.3 % (ref 36.0–46.0)
Hemoglobin: 13.7 g/dL (ref 12.0–15.0)
Immature Granulocytes: 0 %
Lymphocytes Relative: 27 %
Lymphs Abs: 2.2 K/uL (ref 0.7–4.0)
MCH: 30.2 pg (ref 26.0–34.0)
MCHC: 33.2 g/dL (ref 30.0–36.0)
MCV: 91 fL (ref 80.0–100.0)
Monocytes Absolute: 0.6 K/uL (ref 0.1–1.0)
Monocytes Relative: 8 %
Neutro Abs: 4.8 K/uL (ref 1.7–7.7)
Neutrophils Relative %: 61 %
Platelets: 235 K/uL (ref 150–400)
RBC: 4.54 MIL/uL (ref 3.87–5.11)
RDW: 13.3 % (ref 11.5–15.5)
WBC: 8 K/uL (ref 4.0–10.5)
nRBC: 0 % (ref 0.0–0.2)

## 2023-12-29 MED ORDER — ACETAMINOPHEN 325 MG PO TABS: 650.0000 mg | ORAL_TABLET | Freq: Four times a day (QID) | ORAL | Status: DC | PRN

## 2023-12-29 MED ORDER — KETOROLAC TROMETHAMINE 15 MG/ML IJ SOLN
15.0000 mg | Freq: Four times a day (QID) | INTRAMUSCULAR | Status: AC | PRN
  Administered 2023-12-29 – 2023-12-31 (×6): 15 mg via INTRAVENOUS
  Filled 2023-12-29 (×6): qty 1

## 2023-12-29 MED ORDER — KETOROLAC TROMETHAMINE 60 MG/2ML IM SOLN
60.0000 mg | Freq: Once | INTRAMUSCULAR | Status: AC
  Administered 2023-12-29: 60 mg via INTRAMUSCULAR
  Filled 2023-12-29: qty 2

## 2023-12-29 MED ORDER — TRAMADOL HCL 50 MG PO TABS
50.0000 mg | ORAL_TABLET | Freq: Once | ORAL | Status: AC
  Administered 2023-12-29: 50 mg via ORAL
  Filled 2023-12-29: qty 1

## 2023-12-29 MED ORDER — FENTANYL CITRATE PF 50 MCG/ML IJ SOSY
50.0000 ug | PREFILLED_SYRINGE | Freq: Once | INTRAMUSCULAR | Status: DC
  Filled 2023-12-29: qty 1

## 2023-12-29 MED ORDER — ONDANSETRON HCL 4 MG/2ML IJ SOLN
4.0000 mg | Freq: Four times a day (QID) | INTRAMUSCULAR | Status: DC | PRN
  Filled 2023-12-29: qty 2

## 2023-12-29 NOTE — ED Triage Notes (Signed)
 Patient to ED after a mechanical fall yesterday at work. Patient's right knee was twisted behind her after the fall. Pain now around right knee and radiating down right leg. No swelling or obvious deformity noted.

## 2023-12-29 NOTE — ED Provider Notes (Signed)
 [place] EMERGENCY DEPARTMENT AT [facility] Provider Note   CSN: [id number] Arrival date & time: 12/29/23  1130     History  Chief Complaint  Patient presents with   Knee Injury    Workers comp      [name] is a 68 y.o. female.  HPI      68yo female with history of hypertension presents with concern for right knee pain.   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  She reports she has done the same thing with finger dislocations in the past. She is not sure if her knee or kneecap were disclocated but that she had immediate pain.  She has not been able to bear weight on it.  The pain is located in the right knee and to the back.  No ankle pain, numbness, weakness, chest pain, dyspnea, acute neck pain, acute back pain, headache, head trauma or LOC. Not on anticoagulation.  Does have NSAIDs and muscle relaxants for her chronic back pain. Pain is severe. Reports she does feel nausea with narcotics but can tolerate tramadol .  She did go to an urgent care and was sent here.  XR there was concerning for a tibial plateau fracture. Placed in knee immobilizer.  Past Medical History:  Diagnosis Date   Anemia    Arthritis    Chronic pain    Headache    "believe it was gluten related"   History of gallstones    History of pneumonia    History of tuberculosis    Hypertension    Numbness and tingling    PONV (postoperative nausea and vomiting)    Prolonged QT interval    Wears glasses      Home Medications Prior to Admission medications   Medication Sig Start Date End Date Taking? Authorizing Provider  amLODipine -valsartan  (EXFORGE ) 10-160 MG tablet Take 1 tablet by mouth daily. 12/27/15   [provider]  cyclobenzaprine (FLEXERIL) 10 MG tablet   08/24/16   [provider]  GOLDEN SEAL PO Take 222 mg by mouth daily. Golden Seal liquid - 222mg /ml    [provider]  magnesium  oxide (MAG-OX) 400 MG tablet Take 400 mg by mouth daily.     [provider]  Melatonin 10 MG TABS Take 10 mg by mouth at bedtime as needed (sleep).    [provider]  methocarbamol  (ROBAXIN ) 500 MG tablet Take 1 tablet (500 mg total) by mouth every 6 (six) hours as needed for muscle spasms. 02/20/16   Verlan Glazier, PA-C  ondansetron  (ZOFRAN  ODT) 4 MG disintegrating tablet Take 1 tablet (4 mg total) by mouth every 8 (eight) hours as needed. 02/20/16   Verlan Glazier, PA-C  Potassium 99 MG TABS Take 99 mg by mouth at bedtime as needed (leg cramps).    [provider]  traMADol  (ULTRAM ) 50 MG tablet Take 1 tablet (50 mg total) by mouth every 6 (six) hours as needed for moderate pain. 02/20/16   Adah Acron, MD      Allergies    Keflex [cephalexin], Fentanyl , Dairy aid [tilactase], Demerol [meperidine], Gluten meal, Milk-related compounds, Morphine and codeine, and Percocet [oxycodone -acetaminophen ]    Review of Systems   Review of Systems  Physical  Exam Updated Vital Signs BP 126/73   Pulse (!) 103   Temp 98.2 F (36.8 C)   Resp (!) 21   Ht 5\' 4"  (1.626 m)   Wt 85.3 kg   SpO2 100%   BMI 32.27 kg/m  Physical Exam Vitals and nursing note reviewed.  Constitutional:      General: She is not in acute distress.    Appearance: She is well-developed. She is not diaphoretic.  HENT:     Head: Normocephalic and atraumatic.  Eyes:     Conjunctiva/sclera: Conjunctivae normal.  Cardiovascular:     Rate and Rhythm: Normal rate and regular rhythm.     Heart sounds: Normal heart sounds. No murmur heard.    No friction rub. No gallop.  Pulmonary:     Effort: Pulmonary effort is normal. No respiratory distress.     Breath sounds: Normal breath sounds. No wheezing or rales.  Abdominal:     General: There is no  distension.     Palpations: Abdomen is soft.     Tenderness: There is no abdominal tenderness. There is no guarding.  Musculoskeletal:        General: Swelling and tenderness (right knee, right popliteal fossa with pain and swelling, tenderness laterally) present. No deformity.     Cervical back: Normal range of motion.     Comments: Swelling right knee with extension distally, normal bilateral LE pulses, Able to extend knee however with significant pain and is limited  Skin:    General: Skin is warm and dry.     Findings: No erythema or rash.  Neurological:     Mental Status: She is alert and oriented to person, place, and time.     ED Results / Procedures / Treatments   Labs (all labs ordered are listed, but only abnormal results are displayed) Labs Reviewed  BASIC METABOLIC PANEL WITH GFR  CBC WITH DIFFERENTIAL/PLATELET    EKG None  Radiology CT Knee Right Wo Contrast Result Date: 12/29/2023 CLINICAL DATA:  Knee trauma. Tibial plateau fracture. Mechanical fall yesterday at work. EXAM: CT OF THE RIGHT KNEE WITHOUT CONTRAST TECHNIQUE: Multidetector CT imaging of the right knee was performed according to the standard protocol. Multiplanar CT image reconstructions were also generated. RADIATION DOSE REDUCTION: This exam was performed according to the departmental dose-optimization program which includes automated exposure control, adjustment of the mA and/or kV according to patient size and/or use of iterative reconstruction technique. COMPARISON:  Right knee radiographs 12/29/2023 FINDINGS: Bones/Joint/Cartilage Trauma there is there is depression of the lateral tibial plateau in a region measuring up to approximately 20 mm in transverse dimension and 18 mm in AP dimension (coronal series 6, image 41 and sagittal series 7, image 35) with up to approximately 7 mm cortical depression, greatest laterally. This is consistent with a Schatzker III lateral depression fracture. No medial tibial  plateau fracture is seen. Minimal superior patellar degenerative spurring. The patellar apex is approximately 5 mm lateral to the trochlear notch, with overall mild lateralization of the patella. There is mild angulation with a short horizontal "shelf" measuring up to 7 mm in AP dimension at the posterior aspect of the proximal tibial epiphysis/physis region of the medial tibial plateau (sagittal series 7, image 53) without an acute fracture line identified. This likely is chronic. Ligaments Suboptimally assessed by CT. There is a 5 mm chronic oval ossicle overlying the proximal anterior aspect of the medial collateral ligament, likely the sequela of remote trauma. Ligament is visualized and  no full-thickness tear is seen. Muscles and Tendons The mild tibial plateau fracture extends into the anterolateral aspect of the proximal tibia including Gerdy's tubercle at the iliotibial band tendon insertion. There is mild thickening and surrounding stranding in the region of the iliotibial band insertion (axial images 55 through 67 in this region, indicating at least a sprain and possible partial-thickness tear at the insertion. Soft tissues Moderate joint effusion with mildly increased density measuring up to 26 Hounsfield units, likely a hemarthrosis. There is mild subcutaneous fat edema and swelling of the lateral and anterior knee. Dense atherosclerotic calcifications. IMPRESSION: 1. Schatzker III lateral depression fracture of the lateral tibial plateau with up to approximately 7 mm cortical depression, greatest laterally. 2. The lateral tibial plateau fracture extends into the anterolateral aspect of the proximal tibia including Gerdy's tubercle at the iliotibial band tendon insertion. There is mild thickening and surrounding stranding in the region of the iliotibial band insertion, indicating at least a sprain and possible partial-thickness tear at the insertion. 3. Moderate joint effusion with mildly increased  density, likely a hemarthrosis. Electronically Signed   By: Bertina Broccoli M.D.   On: 12/29/2023 15:44   DG Knee 2 Views Right Result Date: 12/29/2023 CLINICAL DATA:  Right knee pain after fall yesterday. EXAM: RIGHT KNEE - 1-2 VIEW COMPARISON:  None Available. FINDINGS: No evidence of fracture, dislocation, or joint effusion. No evidence of arthropathy or other focal bone abnormality. Soft tissues are unremarkable. IMPRESSION: Negative. Electronically Signed   By: Rosalene Colon M.D.   On: 12/29/2023 14:44    Procedures Procedures    Medications Ordered in ED Medications  acetaminophen  (TYLENOL ) tablet 650 mg (has no administration in time range)  ondansetron  (ZOFRAN ) injection 4 mg (has no administration in time range)  ketorolac  (TORADOL ) 15 MG/ML injection 15 mg (15 mg Intravenous Given 12/29/23 2053)  ketorolac  (TORADOL ) injection 60 mg (60 mg Intramuscular Given 12/29/23 1506)  traMADol  (ULTRAM ) tablet 50 mg (50 mg Oral Given 12/29/23 1510)    ED Course/ Medical Decision Making/ A&P          67yo female with history of hypertension presents with concern for right knee pain from a fall while at work.   Denies any other associated injuries, no other concerns on history or exam.  DDx includes fracture, ligamentous injury, meniscal injury, patellar tendon injury.   NV intact, no signs of compartment syndrome.  XR here evaluated by me and does not show signs of fracture, however XR at [facility] concerning for tibial plateau fracture and ordered CT knee and contacted orthopedics [facility].          Final Clinical Impression(s) / ED Diagnoses Final diagnoses:  Closed fracture of right tibial plateau, initial encounter    Rx / DC Orders ED Discharge Orders     None         Scarlette Currier, MD 12/29/23 2142

## 2023-12-29 NOTE — Progress Notes (Signed)
 Dr. Nora Beal giving report: [name] yesterday right knee. Came in today . Xray here shows no acute abnormality but CT was ordered for high suspicion of occult fracture . CT with lateral tibial plateau fracture.   Ortho Dr. Julius Ohs would like [facility] admission for OR admission. [facility] hospitalist will accept transfer. _______________________________________  Labs on Admission:  Results for orders placed or performed during the hospital encounter of 12/29/23 (from the past 24 hours)  Basic metabolic panel     Status: None   Collection Time: 12/29/23  5:13 PM  Result Value Ref Range   Sodium 142 135 - 145 mmol/L   Potassium 3.7 3.5 - 5.1 mmol/L   Chloride 104 98 - 111 mmol/L   CO2 26 22 - 32 mmol/L   Glucose, Bld 87 70 - 99 mg/dL   BUN 10 8 - 23 mg/dL   Creatinine, Ser 1.47 0.44 - 1.00 mg/dL   Calcium 9.7 8.9 - 82.9 mg/dL   GFR, Estimated >56 >21 mL/min   Anion gap 12 5 - 15  CBC with Differential     Status: None   Collection Time: 12/29/23  5:13 PM  Result Value Ref Range   WBC 8.0 4.0 - 10.5 K/uL   RBC 4.54 3.87 - 5.11 MIL/uL   Hemoglobin 13.7 12.0 - 15.0 g/dL   HCT 30.8 65.7 - 84.6 %   MCV 91.0 80.0 - 100.0 fL   MCH 30.2 26.0 - 34.0 pg   MCHC 33.2 30.0 - 36.0 g/dL   RDW 96.2 95.2 - 84.1 %   Platelets 235 150 - 400 K/uL   nRBC 0.0 0.0 - 0.2 %   Neutrophils Relative % 61 %   Neutro Abs 4.8 1.7 - 7.7 K/uL   Lymphocytes Relative 27 %   Lymphs Abs 2.2 0.7 - 4.0 K/uL   Monocytes Relative 8 %   Monocytes Absolute 0.6 0.1 - 1.0 K/uL   Eosinophils Relative 4 %   Eosinophils Absolute 0.3 0.0 - 0.5 K/uL   Basophils Relative 0 %   Basophils Absolute 0.0 0.0 - 0.1 K/uL   Immature Granulocytes 0 %   Abs Immature Granulocytes 0.02 0.00 - 0.07 K/uL   Basic Metabolic Panel: Recent Labs  Lab 12/29/23 1713  NA 142  K 3.7  CL 104  CO2 26  GLUCOSE 87  BUN 10  CREATININE 0.54  CALCIUM 9.7   Liver Function Tests: No results for input(s): "AST", "ALT", "ALKPHOS", "BILITOT", "PROT",  "ALBUMIN" in the last 168 hours. No results for input(s): "LIPASE", "AMYLASE" in the last 168 hours. No results for input(s): "AMMONIA" in the last 168 hours. CBC: Recent Labs  Lab 12/29/23 1713  WBC 8.0  NEUTROABS 4.8  HGB 13.7  HCT 41.3  MCV 91.0  PLT 235   Cardiac Enzymes: No results for input(s): "CKTOTAL", "CKMB", "CKMBINDEX", "TROPONINIHS" in the last 168 hours.  BNP (last 3 results) No results for input(s): "PROBNP" in the last 8760 hours. CBG: No results for input(s): "GLUCAP" in the last 168 hours.  Radiological Exams on Admission:  CT Knee Right Wo Contrast Result Date: 12/29/2023 CLINICAL DATA:  Knee trauma. Tibial plateau fracture. Mechanical fall yesterday at work. EXAM: CT OF THE RIGHT KNEE WITHOUT CONTRAST TECHNIQUE: Multidetector CT imaging of the right knee was performed according to the standard protocol. Multiplanar CT image reconstructions were also generated. RADIATION DOSE REDUCTION: This exam was performed according to the departmental dose-optimization program which includes automated exposure control, adjustment of the mA and/or kV  according to patient size and/or use of iterative reconstruction technique. COMPARISON:  Right knee radiographs 12/29/2023 FINDINGS: Bones/Joint/Cartilage Trauma there is there is depression of the lateral tibial plateau in a region measuring up to approximately 20 mm in transverse dimension and 18 mm in AP dimension (coronal series 6, image 41 and sagittal series 7, image 35) with up to approximately 7 mm cortical depression, greatest laterally. This is consistent with a Schatzker III lateral depression fracture. No medial tibial plateau fracture is seen. Minimal superior patellar degenerative spurring. The patellar apex is approximately 5 mm lateral to the trochlear notch, with overall mild lateralization of the patella. There is mild angulation with a short horizontal "shelf" measuring up to 7 mm in AP dimension at the posterior aspect  of the proximal tibial epiphysis/physis region of the medial tibial plateau (sagittal series 7, image 53) without an acute fracture line identified. This likely is chronic. Ligaments Suboptimally assessed by CT. There is a 5 mm chronic oval ossicle overlying the proximal anterior aspect of the medial collateral ligament, likely the sequela of remote trauma. Ligament is visualized and no full-thickness tear is seen. Muscles and Tendons The mild tibial plateau fracture extends into the anterolateral aspect of the proximal tibia including Gerdy's tubercle at the iliotibial band tendon insertion. There is mild thickening and surrounding stranding in the region of the iliotibial band insertion (axial images 55 through 67 in this region, indicating at least a sprain and possible partial-thickness tear at the insertion. Soft tissues Moderate joint effusion with mildly increased density measuring up to 26 Hounsfield units, likely a hemarthrosis. There is mild subcutaneous fat edema and swelling of the lateral and anterior knee. Dense atherosclerotic calcifications. IMPRESSION: 1. Schatzker III lateral depression fracture of the lateral tibial plateau with up to approximately 7 mm cortical depression, greatest laterally. 2. The lateral tibial plateau fracture extends into the anterolateral aspect of the proximal tibia including Gerdy's tubercle at the iliotibial band tendon insertion. There is mild thickening and surrounding stranding in the region of the iliotibial band insertion, indicating at least a sprain and possible partial-thickness tear at the insertion. 3. Moderate joint effusion with mildly increased density, likely a hemarthrosis. Electronically Signed   By: Bertina Broccoli M.D.   On: 12/29/2023 15:44   DG Knee 2 Views Right Result Date: 12/29/2023 CLINICAL DATA:  Right knee pain after fall yesterday. EXAM: RIGHT KNEE - 1-2 VIEW COMPARISON:  None Available. FINDINGS: No evidence of fracture, dislocation, or  joint effusion. No evidence of arthropathy or other focal bone abnormality. Soft tissues are unremarkable. IMPRESSION: Negative. Electronically Signed   By: Rosalene Colon M.D.   On: 12/29/2023 14:44    chest X-ray   No intake/output data recorded. No intake/output data recorded.

## 2023-12-29 NOTE — ED Provider Notes (Signed)
 Patient signed out to me at 1500 by Dr. Tamela Fake pending CT of R knee. In short this is a 68 year old female with PMH HTN that presented to the ED with R knee pain after a fall. She was seen at urgent care and had XR that showed concern of possible tibial plateau fracture. She was hemodynamically stable on arrival here in no acute distress. Xray here shows no acute abnormality but CT was ordered for high suspicion of occult fracture. She was given tramadol  and toradol  for pain. CT read is pending at this time.  4:02 PM CT with lateral tibial plateau fracture. We discussed with Deadra Everts PA and Dr. Julius Ohs with ortho and are pending recommendations on disposition.  4:25 PM Ortho states she will need surgery. Recommended NPO at midnight and admission to hospitalist at [facility].   Kingsley, Jabron Weese K, DO 12/29/23 1626

## 2023-12-30 ENCOUNTER — Other Ambulatory Visit: Payer: Self-pay

## 2023-12-30 ENCOUNTER — Inpatient Hospital Stay (HOSPITAL_COMMUNITY): Payer: Self-pay

## 2023-12-30 ENCOUNTER — Encounter (HOSPITAL_COMMUNITY): Payer: Self-pay | Admitting: Internal Medicine

## 2023-12-30 ENCOUNTER — Encounter (HOSPITAL_COMMUNITY)
Admission: EM | Disposition: A | Discharge status: Home / Self Care | Payer: Self-pay | Source: Home / Self Care | Attending: Internal Medicine

## 2023-12-30 ENCOUNTER — Inpatient Hospital Stay (HOSPITAL_COMMUNITY): Payer: Worker's Compensation | Admitting: Certified Registered Nurse Anesthetist

## 2023-12-30 DIAGNOSIS — Z885 Allergy status to narcotic agent status: Secondary | ICD-10-CM | POA: Diagnosis not present

## 2023-12-30 DIAGNOSIS — R519 Headache, unspecified: Secondary | ICD-10-CM | POA: Diagnosis not present

## 2023-12-30 DIAGNOSIS — E876 Hypokalemia: Secondary | ICD-10-CM | POA: Diagnosis present

## 2023-12-30 DIAGNOSIS — S89091D Other physeal fracture of upper end of right tibia, subsequent encounter for fracture with routine healing: Secondary | ICD-10-CM | POA: Diagnosis not present

## 2023-12-30 DIAGNOSIS — S82141A Displaced bicondylar fracture of right tibia, initial encounter for closed fracture: Secondary | ICD-10-CM

## 2023-12-30 DIAGNOSIS — I10 Essential (primary) hypertension: Secondary | ICD-10-CM | POA: Diagnosis present

## 2023-12-30 DIAGNOSIS — Z8611 Personal history of tuberculosis: Secondary | ICD-10-CM | POA: Diagnosis not present

## 2023-12-30 DIAGNOSIS — Z8701 Personal history of pneumonia (recurrent): Secondary | ICD-10-CM | POA: Diagnosis not present

## 2023-12-30 DIAGNOSIS — W010XXA Fall on same level from slipping, tripping and stumbling without subsequent striking against object, initial encounter: Secondary | ICD-10-CM | POA: Diagnosis present

## 2023-12-30 DIAGNOSIS — S82121A Displaced fracture of lateral condyle of right tibia, initial encounter for closed fracture: Secondary | ICD-10-CM | POA: Insufficient documentation

## 2023-12-30 DIAGNOSIS — Z79899 Other long term (current) drug therapy: Secondary | ICD-10-CM | POA: Diagnosis not present

## 2023-12-30 DIAGNOSIS — G894 Chronic pain syndrome: Secondary | ICD-10-CM | POA: Diagnosis present

## 2023-12-30 DIAGNOSIS — Y92000 Kitchen of unspecified non-institutional (private) residence as  the place of occurrence of the external cause: Secondary | ICD-10-CM | POA: Diagnosis not present

## 2023-12-30 DIAGNOSIS — Z881 Allergy status to other antibiotic agents status: Secondary | ICD-10-CM | POA: Diagnosis not present

## 2023-12-30 DIAGNOSIS — Y990 Civilian activity done for income or pay: Secondary | ICD-10-CM | POA: Diagnosis not present

## 2023-12-30 DIAGNOSIS — R9431 Abnormal electrocardiogram [ECG] [EKG]: Secondary | ICD-10-CM | POA: Diagnosis present

## 2023-12-30 DIAGNOSIS — Z91018 Allergy to other foods: Secondary | ICD-10-CM | POA: Diagnosis not present

## 2023-12-30 DIAGNOSIS — Z91011 Allergy to milk products: Secondary | ICD-10-CM | POA: Diagnosis not present

## 2023-12-30 DIAGNOSIS — M1990 Unspecified osteoarthritis, unspecified site: Secondary | ICD-10-CM | POA: Diagnosis present

## 2023-12-30 DIAGNOSIS — R110 Nausea: Secondary | ICD-10-CM | POA: Diagnosis not present

## 2023-12-30 DIAGNOSIS — M25561 Pain in right knee: Secondary | ICD-10-CM | POA: Diagnosis present

## 2023-12-30 DIAGNOSIS — M549 Dorsalgia, unspecified: Secondary | ICD-10-CM | POA: Diagnosis present

## 2023-12-30 HISTORY — PX: ORIF TIBIA PLATEAU: SHX2132

## 2023-12-30 LAB — COMPREHENSIVE METABOLIC PANEL WITH GFR
ALT: 30 U/L (ref 0–44)
ALT: 30 U/L (ref 0–44)
AST: 26 U/L (ref 15–41)
AST: 27 U/L (ref 15–41)
Albumin: 3.5 g/dL (ref 3.5–5.0)
Albumin: 3.6 g/dL (ref 3.5–5.0)
Alkaline Phosphatase: 70 U/L (ref 38–126)
Alkaline Phosphatase: 70 U/L (ref 38–126)
Anion gap: 11 (ref 5–15)
Anion gap: 11 (ref 5–15)
BUN: 11 mg/dL (ref 8–23)
BUN: 11 mg/dL (ref 8–23)
CO2: 25 mmol/L (ref 22–32)
CO2: 25 mmol/L (ref 22–32)
Calcium: 8.9 mg/dL (ref 8.9–10.3)
Calcium: 9 mg/dL (ref 8.9–10.3)
Chloride: 103 mmol/L (ref 98–111)
Chloride: 104 mmol/L (ref 98–111)
Creatinine, Ser: 0.48 mg/dL (ref 0.44–1.00)
Creatinine, Ser: 0.54 mg/dL (ref 0.44–1.00)
GFR, Estimated: >60 mL/min (ref >60)
GFR, Estimated: >60 mL/min (ref >60)
Glucose, Bld: 94 mg/dL (ref 70–99)
Glucose, Bld: 95 mg/dL (ref 70–99)
Potassium: 3.4 mmol/L — ABNORMAL LOW (ref 3.5–5.1)
Potassium: 3.4 mmol/L — ABNORMAL LOW (ref 3.5–5.1)
Sodium: 139 mmol/L (ref 135–145)
Sodium: 140 mmol/L (ref 135–145)
Total Bilirubin: 0.6 mg/dL (ref 0.0–1.2)
Total Bilirubin: 0.7 mg/dL (ref 0.0–1.2)
Total Protein: 6.7 g/dL (ref 6.5–8.1)
Total Protein: 6.8 g/dL (ref 6.5–8.1)

## 2023-12-30 LAB — CBC WITH DIFFERENTIAL/PLATELET
Abs Immature Granulocytes: 0.02 10*3/uL (ref 0.00–0.07)
Basophils Absolute: 0 10*3/uL (ref 0.0–0.1)
Basophils Relative: 1 %
Eosinophils Absolute: 0.3 10*3/uL (ref 0.0–0.5)
Eosinophils Relative: 4 %
HCT: 40 % (ref 36.0–46.0)
Hemoglobin: 13 g/dL (ref 12.0–15.0)
Immature Granulocytes: 0 %
Lymphocytes Relative: 27 %
Lymphs Abs: 2 10*3/uL (ref 0.7–4.0)
MCH: 30.2 pg (ref 26.0–34.0)
MCHC: 32.5 g/dL (ref 30.0–36.0)
MCV: 93 fL (ref 80.0–100.0)
Monocytes Absolute: 0.6 10*3/uL (ref 0.1–1.0)
Monocytes Relative: 8 %
Neutro Abs: 4.6 10*3/uL (ref 1.7–7.7)
Neutrophils Relative %: 60 %
Platelets: 237 10*3/uL (ref 150–400)
RBC: 4.3 MIL/uL (ref 3.87–5.11)
RDW: 13.5 % (ref 11.5–15.5)
WBC: 7.6 10*3/uL (ref 4.0–10.5)
nRBC: 0 % (ref 0.0–0.2)

## 2023-12-30 LAB — HEMOGLOBIN A1C
Hgb A1c MFr Bld: 5.4 % (ref 4.8–5.6)
Mean Plasma Glucose: 108.28 mg/dL

## 2023-12-30 LAB — MAGNESIUM: Magnesium: 2.2 mg/dL (ref 1.7–2.4)

## 2023-12-30 LAB — HIV ANTIBODY (ROUTINE TESTING W REFLEX): HIV Screen 4th Generation wRfx: NONREACTIVE

## 2023-12-30 LAB — SURGICAL PCR SCREEN
MRSA, PCR: NEGATIVE
Staphylococcus aureus: NEGATIVE

## 2023-12-30 LAB — VITAMIN D 25 HYDROXY (VIT D DEFICIENCY, FRACTURES): Vit D, 25-Hydroxy: 53.31 ng/mL (ref 30–100)

## 2023-12-30 LAB — PHOSPHORUS: Phosphorus: 4 mg/dL (ref 2.5–4.6)

## 2023-12-30 SURGERY — OPEN REDUCTION INTERNAL FIXATION (ORIF) TIBIAL PLATEAU
Anesthesia: General | Site: Leg Lower | Laterality: Right

## 2023-12-30 MED ORDER — VANCOMYCIN HCL IN DEXTROSE 1-5 GM/200ML-% IV SOLN: 1000.0000 mg | INTRAVENOUS | Status: AC

## 2023-12-30 MED ORDER — AMLODIPINE BESYLATE-VALSARTAN 10-160 MG PO TABS: 1.0000 | ORAL_TABLET | Freq: Every day | ORAL | Status: DC

## 2023-12-30 MED ORDER — METHOCARBAMOL 500 MG PO TABS
500.0000 mg | ORAL_TABLET | Freq: Four times a day (QID) | ORAL | Status: DC | PRN
  Administered 2023-12-31 (×2): 500 mg via ORAL
  Filled 2023-12-30 (×2): qty 1

## 2023-12-30 MED ORDER — MAGNESIUM OXIDE -MG SUPPLEMENT 400 (240 MG) MG PO TABS
400.0000 mg | ORAL_TABLET | Freq: Every day | ORAL | Status: DC
  Administered 2023-12-30 – 2024-01-01 (×3): 400 mg via ORAL
  Filled 2023-12-30 (×3): qty 1

## 2023-12-30 MED ORDER — SODIUM CHLORIDE 0.9 % IV SOLN: INTRAVENOUS | Status: AC

## 2023-12-30 MED ORDER — KETOROLAC TROMETHAMINE 30 MG/ML IJ SOLN
INTRAMUSCULAR | Status: DC | PRN
  Administered 2023-12-30: 30 mg via INTRAVENOUS

## 2023-12-30 MED ORDER — ONDANSETRON HCL 4 MG/2ML IJ SOLN
INTRAMUSCULAR | Status: DC | PRN
  Administered 2023-12-30: 4 mg via INTRAVENOUS

## 2023-12-30 MED ORDER — VANCOMYCIN HCL IN DEXTROSE 1-5 GM/200ML-% IV SOLN
1000.0000 mg | Freq: Two times a day (BID) | INTRAVENOUS | Status: AC
  Administered 2023-12-30: 1000 mg via INTRAVENOUS
  Filled 2023-12-30: qty 200

## 2023-12-30 MED ORDER — HEPARIN SODIUM (PORCINE) 5000 UNIT/ML IJ SOLN: 5000.0000 [IU] | Freq: Three times a day (TID) | INTRAMUSCULAR | Status: DC

## 2023-12-30 MED ORDER — SUGAMMADEX SODIUM 200 MG/2ML IV SOLN
INTRAVENOUS | Status: DC | PRN
  Administered 2023-12-30: 200 mg via INTRAVENOUS

## 2023-12-30 MED ORDER — LACTATED RINGERS IV SOLN: INTRAVENOUS | Status: DC | PRN

## 2023-12-30 MED ORDER — ORAL CARE MOUTH RINSE: 15.0000 mL | Freq: Once | OROMUCOSAL | Status: DC

## 2023-12-30 MED ORDER — TRANEXAMIC ACID-NACL 1000-0.7 MG/100ML-% IV SOLN
1000.0000 mg | INTRAVENOUS | Status: AC
  Administered 2023-12-30: 1000 mg via INTRAVENOUS

## 2023-12-30 MED ORDER — FENTANYL CITRATE (PF) 250 MCG/5ML IJ SOLN
INTRAMUSCULAR | Status: AC
  Filled 2023-12-30: qty 5

## 2023-12-30 MED ORDER — POLYETHYLENE GLYCOL 3350 17 G PO PACK: 17.0000 g | PACK | Freq: Every day | ORAL | Status: DC | PRN

## 2023-12-30 MED ORDER — VANCOMYCIN HCL 1000 MG IV SOLR
INTRAVENOUS | Status: AC
  Filled 2023-12-30: qty 20

## 2023-12-30 MED ORDER — CHLORHEXIDINE GLUCONATE 0.12 % MT SOLN
OROMUCOSAL | Status: AC
  Filled 2023-12-30: qty 15

## 2023-12-30 MED ORDER — ALBUTEROL SULFATE (2.5 MG/3ML) 0.083% IN NEBU: 2.5000 mg | INHALATION_SOLUTION | RESPIRATORY_TRACT | Status: DC | PRN

## 2023-12-30 MED ORDER — LACTATED RINGERS IV SOLN: INTRAVENOUS | Status: DC

## 2023-12-30 MED ORDER — METOCLOPRAMIDE HCL 5 MG PO TABS: 5.0000 mg | ORAL_TABLET | Freq: Three times a day (TID) | ORAL | Status: DC | PRN

## 2023-12-30 MED ORDER — LIDOCAINE 2% (20 MG/ML) 5 ML SYRINGE
INTRAMUSCULAR | Status: DC | PRN
  Administered 2023-12-30: 80 mg via INTRAVENOUS

## 2023-12-30 MED ORDER — CHLORHEXIDINE GLUCONATE 4 % EX SOLN
60.0000 mL | Freq: Once | CUTANEOUS | Status: AC
  Administered 2023-12-30: 4 via TOPICAL

## 2023-12-30 MED ORDER — DIPHENHYDRAMINE HCL 12.5 MG/5ML PO ELIX: 12.5000 mg | ORAL_SOLUTION | ORAL | Status: DC | PRN

## 2023-12-30 MED ORDER — TRAMADOL HCL 50 MG PO TABS
ORAL_TABLET | ORAL | Status: AC
  Filled 2023-12-30: qty 1

## 2023-12-30 MED ORDER — TRANEXAMIC ACID-NACL 1000-0.7 MG/100ML-% IV SOLN
INTRAVENOUS | Status: AC
  Filled 2023-12-30: qty 100

## 2023-12-30 MED ORDER — LEVOFLOXACIN IN D5W 500 MG/100ML IV SOLN
INTRAVENOUS | Status: AC
  Filled 2023-12-30: qty 100

## 2023-12-30 MED ORDER — DEXAMETHASONE SODIUM PHOSPHATE 10 MG/ML IJ SOLN
INTRAMUSCULAR | Status: DC | PRN
  Administered 2023-12-30: 8 mg via INTRAVENOUS

## 2023-12-30 MED ORDER — PROPOFOL 10 MG/ML IV BOLUS
INTRAVENOUS | Status: DC | PRN
Start: 2023-12-30 — End: 2023-12-30
  Administered 2023-12-30: 120 mg via INTRAVENOUS

## 2023-12-30 MED ORDER — HYDROMORPHONE HCL 1 MG/ML IJ SOLN
INTRAMUSCULAR | Status: AC
  Filled 2023-12-30: qty 1

## 2023-12-30 MED ORDER — ROCURONIUM BROMIDE 10 MG/ML (PF) SYRINGE
PREFILLED_SYRINGE | INTRAVENOUS | Status: DC | PRN
  Administered 2023-12-30: 60 mg via INTRAVENOUS

## 2023-12-30 MED ORDER — ACETAMINOPHEN 500 MG PO TABS
1000.0000 mg | ORAL_TABLET | Freq: Four times a day (QID) | ORAL | Status: DC
  Administered 2023-12-30 – 2024-01-01 (×6): 1000 mg via ORAL
  Filled 2023-12-30 (×8): qty 2

## 2023-12-30 MED ORDER — POTASSIUM CHLORIDE 10 MEQ/100ML IV SOLN
10.0000 meq | INTRAVENOUS | Status: AC
  Administered 2023-12-30 (×4): 10 meq via INTRAVENOUS
  Filled 2023-12-30 (×2): qty 100

## 2023-12-30 MED ORDER — MIDAZOLAM HCL 2 MG/2ML IJ SOLN
INTRAMUSCULAR | Status: DC | PRN
Start: 2023-12-30 — End: 2023-12-30
  Administered 2023-12-30: 2 mg via INTRAVENOUS

## 2023-12-30 MED ORDER — ENOXAPARIN SODIUM 40 MG/0.4ML IJ SOSY
40.0000 mg | PREFILLED_SYRINGE | INTRAMUSCULAR | Status: DC
  Administered 2023-12-31 – 2024-01-01 (×2): 40 mg via SUBCUTANEOUS
  Filled 2023-12-30 (×2): qty 0.4

## 2023-12-30 MED ORDER — POTASSIUM CHLORIDE 10 MEQ/100ML IV SOLN
INTRAVENOUS | Status: AC
  Filled 2023-12-30: qty 200

## 2023-12-30 MED ORDER — DOCUSATE SODIUM 100 MG PO CAPS
100.0000 mg | ORAL_CAPSULE | Freq: Two times a day (BID) | ORAL | Status: DC
  Administered 2023-12-31 – 2024-01-01 (×2): 100 mg via ORAL
  Filled 2023-12-30 (×3): qty 1

## 2023-12-30 MED ORDER — TRAMADOL HCL 50 MG PO TABS
50.0000 mg | ORAL_TABLET | Freq: Four times a day (QID) | ORAL | Status: DC | PRN
  Administered 2023-12-30: 50 mg via ORAL
  Administered 2023-12-31 – 2024-01-01 (×3): 100 mg via ORAL
  Filled 2023-12-30 (×4): qty 2

## 2023-12-30 MED ORDER — ONDANSETRON HCL 4 MG PO TABS
4.0000 mg | ORAL_TABLET | Freq: Four times a day (QID) | ORAL | Status: DC | PRN
Start: 2023-12-30 — End: 2024-01-01
  Administered 2023-12-31: 4 mg via ORAL
  Filled 2023-12-30: qty 1

## 2023-12-30 MED ORDER — HYDRALAZINE HCL 10 MG PO TABS: 10.0000 mg | ORAL_TABLET | Freq: Four times a day (QID) | ORAL | Status: DC | PRN

## 2023-12-30 MED ORDER — IRBESARTAN 150 MG PO TABS
150.0000 mg | ORAL_TABLET | Freq: Every day | ORAL | Status: DC
  Administered 2023-12-30 – 2024-01-01 (×3): 150 mg via ORAL
  Filled 2023-12-30 (×3): qty 1

## 2023-12-30 MED ORDER — MUPIROCIN 2 % EX OINT
1.0000 | TOPICAL_OINTMENT | Freq: Two times a day (BID) | CUTANEOUS | Status: DC
  Administered 2023-12-30 – 2024-01-01 (×5): 1 via NASAL
  Filled 2023-12-30 (×2): qty 22

## 2023-12-30 MED ORDER — PHENYLEPHRINE 80 MCG/ML (10ML) SYRINGE FOR IV PUSH (FOR BLOOD PRESSURE SUPPORT)
PREFILLED_SYRINGE | INTRAVENOUS | Status: DC | PRN
  Administered 2023-12-30 (×2): 160 ug via INTRAVENOUS

## 2023-12-30 MED ORDER — MIDAZOLAM HCL 2 MG/2ML IJ SOLN
INTRAMUSCULAR | Status: AC
  Filled 2023-12-30: qty 2

## 2023-12-30 MED ORDER — AMLODIPINE BESYLATE 10 MG PO TABS
10.0000 mg | ORAL_TABLET | Freq: Every day | ORAL | Status: DC
  Administered 2023-12-30 – 2024-01-01 (×3): 10 mg via ORAL
  Filled 2023-12-30 (×3): qty 1

## 2023-12-30 MED ORDER — HYDROMORPHONE HCL 1 MG/ML IJ SOLN
0.2500 mg | INTRAMUSCULAR | Status: DC | PRN
  Administered 2023-12-30: 0.5 mg via INTRAVENOUS
  Administered 2023-12-30: 0.25 mg via INTRAVENOUS

## 2023-12-30 MED ORDER — 0.9 % SODIUM CHLORIDE (POUR BTL) OPTIME
TOPICAL | Status: DC | PRN
  Administered 2023-12-30: 1000 mL

## 2023-12-30 MED ORDER — POVIDONE-IODINE 10 % EX SWAB
2.0000 | Freq: Once | CUTANEOUS | Status: AC
  Administered 2023-12-30: 2 via TOPICAL

## 2023-12-30 MED ORDER — ONDANSETRON HCL 4 MG/2ML IJ SOLN: 4.0000 mg | Freq: Once | INTRAMUSCULAR | Status: DC | PRN

## 2023-12-30 MED ORDER — METOCLOPRAMIDE HCL 5 MG/ML IJ SOLN: 5.0000 mg | Freq: Three times a day (TID) | INTRAMUSCULAR | Status: DC | PRN

## 2023-12-30 MED ORDER — DROPERIDOL 2.5 MG/ML IJ SOLN: 0.6250 mg | Freq: Once | INTRAMUSCULAR | Status: DC | PRN

## 2023-12-30 MED ORDER — LEVOFLOXACIN IN D5W 500 MG/100ML IV SOLN
500.0000 mg | INTRAVENOUS | Status: AC
  Administered 2023-12-30: 500 mg via INTRAVENOUS

## 2023-12-30 MED ORDER — HYDROCODONE-ACETAMINOPHEN 5-325 MG PO TABS
1.0000 | ORAL_TABLET | ORAL | Status: AC | PRN
  Administered 2023-12-30: 2 via ORAL
  Filled 2023-12-30: qty 2

## 2023-12-30 MED ORDER — VANCOMYCIN HCL 1000 MG IV SOLR
INTRAVENOUS | Status: DC | PRN
  Administered 2023-12-30: 1000 mg

## 2023-12-30 MED ORDER — ONDANSETRON HCL 4 MG/2ML IJ SOLN
4.0000 mg | Freq: Four times a day (QID) | INTRAMUSCULAR | Status: DC | PRN
  Administered 2023-12-31: 4 mg via INTRAVENOUS
  Filled 2023-12-30: qty 2

## 2023-12-30 MED ORDER — VANCOMYCIN HCL IN DEXTROSE 1-5 GM/200ML-% IV SOLN
INTRAVENOUS | Status: AC
  Administered 2023-12-30: 1000 mg via INTRAVENOUS
  Filled 2023-12-30: qty 200

## 2023-12-30 MED ORDER — FENTANYL CITRATE (PF) 250 MCG/5ML IJ SOLN
INTRAMUSCULAR | Status: DC | PRN
  Administered 2023-12-30: 50 ug via INTRAVENOUS
  Administered 2023-12-30: 100 ug via INTRAVENOUS
  Administered 2023-12-30: 50 ug via INTRAVENOUS

## 2023-12-30 MED ORDER — CHLORHEXIDINE GLUCONATE 0.12 % MT SOLN: 15.0000 mL | Freq: Once | OROMUCOSAL | Status: DC

## 2023-12-30 MED ORDER — TRAMADOL HCL 50 MG PO TABS
50.0000 mg | ORAL_TABLET | Freq: Four times a day (QID) | ORAL | Status: DC | PRN
  Administered 2023-12-30: 50 mg via ORAL

## 2023-12-30 SURGICAL SUPPLY — 59 items
BAG COUNTER SPONGE SURGICOUNT (BAG) ×1 IMPLANT
BANDAGE ESMARK 6X9 LF (GAUZE/BANDAGES/DRESSINGS) ×1 IMPLANT
BENZOIN TINCTURE PRP APPL 2/3 (GAUZE/BANDAGES/DRESSINGS) IMPLANT
BIT DRILL CALIBR QC 2.8X250 (BIT) IMPLANT
BIT DRILL QC SFS 2.5X170 (BIT) IMPLANT
BLADE CLIPPER SURG (BLADE) IMPLANT
BLADE SURG 15 STRL LF DISP TIS (BLADE) ×1 IMPLANT
BNDG ELASTIC 4INX 5YD STR LF (GAUZE/BANDAGES/DRESSINGS) IMPLANT
BNDG ELASTIC 4X5.8 VLCR STR LF (GAUZE/BANDAGES/DRESSINGS) ×1 IMPLANT
BNDG ELASTIC 6INX 5YD STR LF (GAUZE/BANDAGES/DRESSINGS) ×1 IMPLANT
BNDG GAUZE DERMACEA FLUFF 4 (GAUZE/BANDAGES/DRESSINGS) ×1 IMPLANT
BRUSH SCRUB EZ PLAIN DRY (MISCELLANEOUS) ×2 IMPLANT
CANISTER SUCT 3000ML PPV (MISCELLANEOUS) ×1 IMPLANT
CHLORAPREP W/TINT 26 (MISCELLANEOUS) ×2 IMPLANT
CLSR STERI-STRIP ANTIMIC 1/2X4 (GAUZE/BANDAGES/DRESSINGS) IMPLANT
COVER SURGICAL LIGHT HANDLE (MISCELLANEOUS) ×1 IMPLANT
CUFF TRNQT CYL 34X4.125X (TOURNIQUET CUFF) ×1 IMPLANT
DRAPE C-ARM 42X72 X-RAY (DRAPES) ×1 IMPLANT
DRAPE C-ARMOR (DRAPES) ×1 IMPLANT
DRAPE SURG ORHT 6 SPLT 77X108 (DRAPES) ×2 IMPLANT
DRAPE U-SHAPE 47X51 STRL (DRAPES) ×1 IMPLANT
ELECTRODE REM PT RTRN 9FT ADLT (ELECTROSURGICAL) ×1 IMPLANT
GAUZE PAD ABD 8X10 STRL (GAUZE/BANDAGES/DRESSINGS) ×2 IMPLANT
GAUZE SPONGE 4X4 12PLY STRL (GAUZE/BANDAGES/DRESSINGS) ×1 IMPLANT
GLOVE BIO SURGEON STRL SZ 6.5 (GLOVE) ×3 IMPLANT
GLOVE BIO SURGEON STRL SZ7.5 (GLOVE) ×4 IMPLANT
GLOVE BIOGEL PI IND STRL 6.5 (GLOVE) ×1 IMPLANT
GLOVE BIOGEL PI IND STRL 7.5 (GLOVE) ×1 IMPLANT
GOWN STRL REUS W/ TWL LRG LVL3 (GOWN DISPOSABLE) ×2 IMPLANT
IMMOBILIZER KNEE 22 UNIV (SOFTGOODS) ×1 IMPLANT
KIT BASIN OR (CUSTOM PROCEDURE TRAY) ×1 IMPLANT
KIT TURNOVER KIT B (KITS) ×1 IMPLANT
KWIRE 1.6X150 (WIRE) IMPLANT
NDL SUT 6 .5 CRC .975X.05 MAYO (NEEDLE) ×1 IMPLANT
NS IRRIG 1000ML POUR BTL (IV SOLUTION) ×1 IMPLANT
PACK TOTAL JOINT (CUSTOM PROCEDURE TRAY) ×1 IMPLANT
PAD ARMBOARD POSITIONER FOAM (MISCELLANEOUS) ×2 IMPLANT
PAD CAST 4YDX4 CTTN HI CHSV (CAST SUPPLIES) ×1 IMPLANT
PADDING CAST ABS COTTON 6X4 NS (CAST SUPPLIES) IMPLANT
PADDING CAST COTTON 6X4 STRL (CAST SUPPLIES) ×1 IMPLANT
PLATE PROX TIBIA RT 4H (Plate) IMPLANT
SCREW CORT 3.5X46M SELF TAP (Screw) IMPLANT
SCREW CORTEX 3.5 70MM SELF TAP (Screw) IMPLANT
SCREW CORTEX 3.5X75MM (Screw) IMPLANT
SCREW LOCK CORT ST 3.5X40 (Screw) IMPLANT
SCREW LOCKING 3.5X80MM VA (Screw) IMPLANT
SCREW LOCKING VA 3.5X75MM (Screw) IMPLANT
STAPLER VISISTAT 35W (STAPLE) ×1 IMPLANT
SUCTION TUBE FRAZIER 10FR DISP (SUCTIONS) ×1 IMPLANT
SUT ETHILON 2 0 FS 18 (SUTURE) ×1 IMPLANT
SUT ETHILON 3 0 PS 1 (SUTURE) IMPLANT
SUT VIC AB 0 CT1 27XBRD ANBCTR (SUTURE) IMPLANT
SUT VIC AB 1 CT1 18XCR BRD 8 (SUTURE) IMPLANT
SUT VIC AB 1 CT1 27XBRD ANBCTR (SUTURE) ×1 IMPLANT
SUT VIC AB 2-0 CT1 TAPERPNT 27 (SUTURE) ×2 IMPLANT
SUTURE FIBERWR #2 38 T-5 BLUE (SUTURE) IMPLANT
TOWEL GREEN STERILE (TOWEL DISPOSABLE) ×2 IMPLANT
TRAY FOLEY MTR SLVR 16FR STAT (SET/KITS/TRAYS/PACK) IMPLANT
WATER STERILE IRR 1000ML POUR (IV SOLUTION) ×2 IMPLANT

## 2023-12-30 NOTE — Anesthesia Postprocedure Evaluation (Signed)
 Anesthesia Post Note  Patient: [name]  Procedure(s) Performed: OPEN REDUCTION INTERNAL FIXATION TIBIAL PLATEAU (Right: Leg Lower)     Patient location during evaluation: PACU Anesthesia Type: General Level of consciousness: awake and alert and oriented Pain management: pain level controlled Vital Signs Assessment: post-procedure vital signs reviewed and stable Respiratory status: spontaneous breathing, nonlabored ventilation and respiratory function stable Cardiovascular status: blood pressure returned to baseline and stable Postop Assessment: no apparent nausea or vomiting Anesthetic complications: no   No notable events documented.  Last Vitals:  Vitals:   12/30/23 1343 12/30/23 1756  BP: 119/83 127/80  Pulse: (!) 109 (!) 122  Resp: 16 17  Temp: 36.7 C 36.6 C  SpO2:  100%    Last Pain:  Vitals:   12/30/23 1756  TempSrc: Oral  PainSc:                  Nautika Cressey A.

## 2023-12-30 NOTE — Anesthesia Procedure Notes (Signed)
 Procedure Name: Intubation Date/Time: 12/30/2023 11:15 AM  Performed by: Rochelle Chu, CRNAPre-anesthesia Checklist: Patient identified, Emergency Drugs available, Suction available and Patient being monitored Patient Re-evaluated:Patient Re-evaluated prior to induction Oxygen Delivery Method: Circle system utilized Preoxygenation: Pre-oxygenation with 100% oxygen Induction Type: IV induction Ventilation: Mask ventilation without difficulty Laryngoscope Size: Mac and 3 Grade View: Grade I Tube type: Oral Tube size: 7.5 mm Number of attempts: 1 Airway Equipment and Method: Stylet and Oral airway Placement Confirmation: ETT inserted through vocal cords under direct vision, positive ETCO2 and breath sounds checked- equal and bilateral Secured at: 22 cm Tube secured with: Tape Dental Injury: Teeth and Oropharynx as per pre-operative assessment

## 2023-12-30 NOTE — Progress Notes (Signed)
 OT Cancellation Note  Patient Details Name: [name] MRN: [id number] DOB: [birth year]   Cancelled Treatment:    Reason Eval/Treat Not Completed: Patient at procedure or test/ unavailable (Plan for R tibial ORIF today. OT to see pt post-op as appropriate/available.)  Aminta Kales "Darral Ellis., OTR/L, MA Acute Rehab [phone]  Walt Gunner 12/30/2023, 8:48 AM

## 2023-12-30 NOTE — Plan of Care (Signed)

## 2023-12-30 NOTE — Progress Notes (Addendum)
 Brief same day note:   Patient is a 68 year old female with history of anemia, arthritis, chronic pain syndrome, hypertension who presented to the emergency department after mechanical fall at work complaining of right knee pain.   she was hemodynamically stable on presentation.  CT hip showed right lateral tibial plateau fracture.Orthopedics consulted.  Plan for ORIF.  Patient seen and examined at bedside today.  Hemodynamically stable.  Overall comfortable.  Complains of pain on the right knee.  Waiting for surgery today.  Not in any kind of distress   Assessment and plan:  Right lateral tibia plateau fracture: Continue pain management, supportive care.  Orthopedics consulted.  Plan for ORIF.  Hypertension: Currently blood pressure stable.  Continue current medications  History of prolonged QT:  Avoid QT prolonging medications.  EKG stable.  Monitor electrolytes intermittently  Hypokalemia: Supplemented with potassium

## 2023-12-30 NOTE — H&P (View-Only) (Signed)
 Reason for Consult:Right tibia plateau fx Referring Physician: Leona Rake Time called: 0732 Time at bedside: 0903   [name] is an 68 y.o. female.  HPI: Any was at work when she slipped on a dip in the floor and fell. She had immediate right knee pain. She was able to get up but not able to bear weight and was brought to the ED. X-rays showed a tibia plateau fx and orthopedic surgery was consulted. She lives at home with her husband, does not use any assistive devices, and works as a Ship broker.  Past Medical History:  Diagnosis Date   Anemia    Arthritis    Chronic pain    Headache    "believe it was gluten related"   History of gallstones    History of pneumonia    History of tuberculosis    Hypertension    Numbness and tingling    PONV (postoperative nausea and vomiting)    Prolonged QT interval    Wears glasses     Past Surgical History:  Procedure Laterality Date   ANTERIOR CERVICAL DECOMP/DISCECTOMY FUSION  02/2016   ANTERIOR CERVICAL DECOMP/DISCECTOMY FUSION N/A 02/19/2016   Procedure: C3-4, C4-5 Anteior Cervical Discectomy and Fusion, Allograft, Plate;  Surgeon: Adah Acron, MD;  Location: [facility] OR;  Service: Orthopedics;  Laterality: N/A;   CARPAL TUNNEL RELEASE Right 02/19/2016   Procedure: Right Carpal Tunnel Release;  Surgeon: Adah Acron, MD;  Location: [facility] OR;  Service: Orthopedics;  Laterality: Right;   CHOLECYSTECTOMY     COLONOSCOPY     DILATATION & CURRETTAGE/HYSTEROSCOPY WITH RESECTOCOPE N/A 10/30/2013   Procedure: DILATATION & CURETTAGE/HYSTEROSCOPY WITH RESECTOCOPE;  Surgeon: Kandra Orn, MD;  Location: [facility] ORS;  Service: Gynecology;  Laterality: N/A;  1 hr.   ESOPHAGOGASTRODUODENOSCOPY      History reviewed. No pertinent family history.  Social History:  reports that she has never smoked. She has never used smokeless tobacco. She reports current alcohol use. She reports that she does not use drugs.  Allergies:  Allergies   Allergen Reactions   Keflex [Cephalexin] Nausea And Vomiting and Other (See Comments)    Headaches Pt. Says she CAN tolerate Pcn & Amoxicillin   Fentanyl  Nausea And Vomiting and Other (See Comments)    Headache, n/v    Dairy Aid [Tilactase] Rash   Demerol [Meperidine] Nausea And Vomiting   Gluten Meal Other (See Comments)    headaches   Milk-Related Compounds Rash   Morphine And Codeine Nausea And Vomiting and Other (See Comments)    headache   Percocet [Oxycodone -Acetaminophen ] Nausea And Vomiting    Medications: I have reviewed the patient's current medications.  Results for orders placed or performed during the hospital encounter of 12/29/23 (from the past 48 hours)  Basic metabolic panel     Status: None   Collection Time: 12/29/23  5:13 PM  Result Value Ref Range   Sodium 142 135 - 145 mmol/L   Potassium 3.7 3.5 - 5.1 mmol/L   Chloride 104 98 - 111 mmol/L   CO2 26 22 - 32 mmol/L   Glucose, Bld 87 70 - 99 mg/dL    Comment: Glucose reference range applies only to samples taken after fasting for at least 8 hours.   BUN 10 8 - 23 mg/dL   Creatinine, Ser 9.60 0.44 - 1.00 mg/dL   Calcium 9.7 8.9 - 45.4 mg/dL   GFR, Estimated >09 >81 mL/min    Comment: (NOTE) Calculated using the CKD-EPI  Creatinine Equation (2021)    Anion gap 12 5 - 15    Comment: Performed at [facility], [address], [place], [place] [address]  CBC with Differential     Status: None   Collection Time: 12/29/23  5:13 PM  Result Value Ref Range   WBC 8.0 4.0 - 10.5 K/uL   RBC 4.54 3.87 - 5.11 MIL/uL   Hemoglobin 13.7 12.0 - 15.0 g/dL   HCT 95.1 88.4 - 16.6 %   MCV 91.0 80.0 - 100.0 fL   MCH 30.2 26.0 - 34.0 pg   MCHC 33.2 30.0 - 36.0 g/dL   RDW 06.3 01.6 - 01.0 %   Platelets 235 150 - 400 K/uL   nRBC 0.0 0.0 - 0.2 %   Neutrophils Relative % 61 %   Neutro Abs 4.8 1.7 - 7.7 K/uL   Lymphocytes Relative 27 %   Lymphs Abs 2.2 0.7 - 4.0 K/uL   Monocytes Relative 8 %    Monocytes Absolute 0.6 0.1 - 1.0 K/uL   Eosinophils Relative 4 %   Eosinophils Absolute 0.3 0.0 - 0.5 K/uL   Basophils Relative 0 %   Basophils Absolute 0.0 0.0 - 0.1 K/uL   Immature Granulocytes 0 %   Abs Immature Granulocytes 0.02 0.00 - 0.07 K/uL    Comment: Performed at [facility], [address], [place], [place] [address]  Surgical PCR screen     Status: None   Collection Time: 12/30/23  3:01 AM   Specimen: Nasal Mucosa; Nasal Swab  Result Value Ref Range   MRSA, PCR NEGATIVE NEGATIVE   Staphylococcus aureus NEGATIVE NEGATIVE    Comment: (NOTE) The Xpert SA Assay (FDA approved for NASAL specimens in patients 10 years of age and older), is one component of a comprehensive surveillance program. It is not intended to diagnose infection nor to guide or monitor treatment. Performed at [facility], [address] [place], [place] [address]   Comprehensive metabolic panel     Status: Abnormal   Collection Time: 12/30/23  6:19 AM  Result Value Ref Range   Sodium 139 135 - 145 mmol/L   Potassium 3.4 (L) 3.5 - 5.1 mmol/L   Chloride 103 98 - 111 mmol/L   CO2 25 22 - 32 mmol/L   Glucose, Bld 94 70 - 99 mg/dL    Comment: Glucose reference range applies only to samples taken after fasting for at least 8 hours.   BUN 11 8 - 23 mg/dL   Creatinine, Ser 0.25 0.44 - 1.00 mg/dL   Calcium 8.9 8.9 - 42.7 mg/dL   Total Protein 6.7 6.5 - 8.1 g/dL   Albumin 3.6 3.5 - 5.0 g/dL   AST 27 15 - 41 U/L   ALT 30 0 - 44 U/L   Alkaline Phosphatase 70 38 - 126 U/L   Total Bilirubin 0.6 0.0 - 1.2 mg/dL   GFR, Estimated >06 >23 mL/min    Comment: (NOTE) Calculated using the CKD-EPI Creatinine Equation (2021)    Anion gap 11 5 - 15    Comment: Performed at [facility], [address] [place], [place] [address]  Magnesium      Status: None   Collection Time: 12/30/23  6:19 AM  Result Value Ref Range   Magnesium  2.2 1.7 - 2.4 mg/dL    Comment: Performed at [facility], [address] [place], [place] [address]  Phosphorus     Status: None  Collection Time: 12/30/23  6:19 AM  Result Value Ref Range   Phosphorus 4.0 2.5 - 4.6 mg/dL    Comment: Performed at [facility], [address] [place], [place] [address]  CBC with Differential/Platelet     Status: None   Collection Time: 12/30/23  6:19 AM  Result Value Ref Range   WBC 7.6 4.0 - 10.5 K/uL   RBC 4.30 3.87 - 5.11 MIL/uL   Hemoglobin 13.0 12.0 - 15.0 g/dL   HCT 60.4 54.0 - 98.1 %   MCV 93.0 80.0 - 100.0 fL   MCH 30.2 26.0 - 34.0 pg   MCHC 32.5 30.0 - 36.0 g/dL   RDW 19.1 47.8 - 29.5 %   Platelets 237 150 - 400 K/uL   nRBC 0.0 0.0 - 0.2 %   Neutrophils Relative % 60 %   Neutro Abs 4.6 1.7 - 7.7 K/uL   Lymphocytes Relative 27 %   Lymphs Abs 2.0 0.7 - 4.0 K/uL   Monocytes Relative 8 %   Monocytes Absolute 0.6 0.1 - 1.0 K/uL   Eosinophils Relative 4 %   Eosinophils Absolute 0.3 0.0 - 0.5 K/uL   Basophils Relative 1 %   Basophils Absolute 0.0 0.0 - 0.1 K/uL   Immature Granulocytes 0 %   Abs Immature Granulocytes 0.02 0.00 - 0.07 K/uL    Comment: Performed at [facility], [address], [place], [place] [address]  Hemoglobin A1c     Status: None   Collection Time: 12/30/23  6:19 AM  Result Value Ref Range   Hgb A1c MFr Bld 5.4 4.8 - 5.6 %    Comment: (NOTE) Pre diabetes:          5.7%-6.4%  Diabetes:              >6.4%  Glycemic control for   <7.0% adults with diabetes    Mean Plasma Glucose 108.28 mg/dL    Comment: Performed at [facility], [address], [place], [place] [address]  Comprehensive metabolic panel     Status: Abnormal   Collection Time: 12/30/23  6:21 AM  Result Value Ref Range   Sodium 140 135 - 145 mmol/L   Potassium 3.4 (L) 3.5 - 5.1 mmol/L   Chloride 104 98 - 111 mmol/L   CO2 25 22 - 32 mmol/L   Glucose, Bld 95 70 - 99 mg/dL    Comment: Glucose reference range applies only to samples taken after fasting for at least 8 hours.    BUN 11 8 - 23 mg/dL   Creatinine, Ser 4.69 0.44 - 1.00 mg/dL   Calcium 9.0 8.9 - 62.9 mg/dL   Total Protein 6.8 6.5 - 8.1 g/dL   Albumin 3.5 3.5 - 5.0 g/dL   AST 26 15 - 41 U/L   ALT 30 0 - 44 U/L   Alkaline Phosphatase 70 38 - 126 U/L   Total Bilirubin 0.7 0.0 - 1.2 mg/dL   GFR, Estimated >52 >84 mL/min    Comment: (NOTE) Calculated using the CKD-EPI Creatinine Equation (2021)    Anion gap 11 5 - 15    Comment: Performed at [facility], [address] [place], [place] [address]    CT Knee Right Wo Contrast Result Date: 12/29/2023 CLINICAL DATA:  Knee trauma. Tibial plateau fracture. Mechanical fall yesterday at work. EXAM: CT OF THE RIGHT KNEE WITHOUT CONTRAST TECHNIQUE: Multidetector CT imaging of the right knee was performed according to the  standard protocol. Multiplanar CT image reconstructions were also generated. RADIATION DOSE REDUCTION: This exam was performed according to the departmental dose-optimization program which includes automated exposure control, adjustment of the mA and/or kV according to patient size and/or use of iterative reconstruction technique. COMPARISON:  Right knee radiographs 12/29/2023 FINDINGS: Bones/Joint/Cartilage Trauma there is there is depression of the lateral tibial plateau in a region measuring up to approximately 20 mm in transverse dimension and 18 mm in AP dimension (coronal series 6, image 41 and sagittal series 7, image 35) with up to approximately 7 mm cortical depression, greatest laterally. This is consistent with a Schatzker III lateral depression fracture. No medial tibial plateau fracture is seen. Minimal superior patellar degenerative spurring. The patellar apex is approximately 5 mm lateral to the trochlear notch, with overall mild lateralization of the patella. There is mild angulation with a short horizontal "shelf" measuring up to 7 mm in AP dimension at the posterior aspect of the proximal tibial epiphysis/physis region of the  medial tibial plateau (sagittal series 7, image 53) without an acute fracture line identified. This likely is chronic. Ligaments Suboptimally assessed by CT. There is a 5 mm chronic oval ossicle overlying the proximal anterior aspect of the medial collateral ligament, likely the sequela of remote trauma. Ligament is visualized and no full-thickness tear is seen. Muscles and Tendons The mild tibial plateau fracture extends into the anterolateral aspect of the proximal tibia including Gerdy's tubercle at the iliotibial band tendon insertion. There is mild thickening and surrounding stranding in the region of the iliotibial band insertion (axial images 55 through 67 in this region, indicating at least a sprain and possible partial-thickness tear at the insertion. Soft tissues Moderate joint effusion with mildly increased density measuring up to 26 Hounsfield units, likely a hemarthrosis. There is mild subcutaneous fat edema and swelling of the lateral and anterior knee. Dense atherosclerotic calcifications. IMPRESSION: 1. Schatzker III lateral depression fracture of the lateral tibial plateau with up to approximately 7 mm cortical depression, greatest laterally. 2. The lateral tibial plateau fracture extends into the anterolateral aspect of the proximal tibia including Gerdy's tubercle at the iliotibial band tendon insertion. There is mild thickening and surrounding stranding in the region of the iliotibial band insertion, indicating at least a sprain and possible partial-thickness tear at the insertion. 3. Moderate joint effusion with mildly increased density, likely a hemarthrosis. Electronically Signed   By: Bertina Broccoli M.D.   On: 12/29/2023 15:44   DG Knee 2 Views Right Result Date: 12/29/2023 CLINICAL DATA:  Right knee pain after fall yesterday. EXAM: RIGHT KNEE - 1-2 VIEW COMPARISON:  None Available. FINDINGS: No evidence of fracture, dislocation, or joint effusion. No evidence of arthropathy or other focal  bone abnormality. Soft tissues are unremarkable. IMPRESSION: Negative. Electronically Signed   By: Rosalene Colon M.D.   On: 12/29/2023 14:44    Review of Systems  HENT:  Negative for ear discharge, ear pain, hearing loss and tinnitus.   Eyes:  Negative for photophobia and pain.  Respiratory:  Negative for cough and shortness of breath.   Cardiovascular:  Negative for chest pain.  Gastrointestinal:  Negative for abdominal pain, nausea and vomiting.  Genitourinary:  Negative for dysuria, flank pain, frequency and urgency.  Musculoskeletal:  Positive for arthralgias (Right knee). Negative for back pain, myalgias and neck pain.  Neurological:  Negative for dizziness and headaches.  Hematological:  Does not bruise/bleed easily.  Psychiatric/Behavioral:  The patient is not nervous/anxious.  Blood pressure 119/78, pulse (!) 103, temperature 98 F (36.7 C), temperature source Oral, resp. rate 18, height 5\' 4"  (1.626 m), weight 85.3 kg, SpO2 99%. Physical Exam Constitutional:      General: She is not in acute distress.    Appearance: She is well-developed. She is not diaphoretic.  HENT:     Head: Normocephalic and atraumatic.  Eyes:     General: No scleral icterus.       Right eye: No discharge.        Left eye: No discharge.     Conjunctiva/sclera: Conjunctivae normal.  Cardiovascular:     Rate and Rhythm: Normal rate and regular rhythm.  Pulmonary:     Effort: Pulmonary effort is normal. No respiratory distress.  Musculoskeletal:     Cervical back: Normal range of motion.     Comments: RLE No traumatic wounds, ecchymosis, or rash  Mod TTP knee  No ankle effusion  Sens DPN, SPN, TN intact  Motor EHL, ext, flex, evers 5/5  DP 2+, PT 2+, No significant edema  Skin:    General: Skin is warm and dry.  Neurological:     Mental Status: She is alert.  Psychiatric:        Mood and Affect: Mood normal.        Behavior: Behavior normal.     Assessment/Plan: Right tibia plateau fx  -- Plan ORIF today with Dr. Curtiss Dowdy. Please keep NPO. Multiple medical problems including Anemia, Arthritis, Chronic pain , HTN , history of prolonged QT -- per primary service    Georganna Kin, PA-C Orthopedic Surgery [phone] 12/30/2023, 9:25 AM

## 2023-12-30 NOTE — Anesthesia Preprocedure Evaluation (Addendum)
 Anesthesia Evaluation  Patient identified by MRN, date of birth, ID band Patient awake    Reviewed: Allergy & Precautions, NPO status , Patient's Chart, lab work & pertinent test results  History of Anesthesia Complications (+) PONV and history of anesthetic complications  Airway Mallampati: II  TM Distance: >3 FB     Dental  (+) Teeth Intact, Dental Advisory Given, Caps   Pulmonary neg pulmonary ROS   Pulmonary exam normal breath sounds clear to auscultation       Cardiovascular hypertension, Pt. on medications Normal cardiovascular exam Rhythm:Regular Rate:Normal     Neuro/Psych  Headaches  Neuromuscular disease  negative psych ROS   GI/Hepatic negative GI ROS, Neg liver ROS,,,  Endo/Other  negative endocrine ROS    Renal/GU negative Renal ROS  negative genitourinary   Musculoskeletal  (+) Arthritis , Osteoarthritis,  Right Tibial plateau Fx   Abdominal  (+) + obese  Peds  Hematology  (+) Blood dyscrasia, anemia   Anesthesia Other Findings   Reproductive/Obstetrics                             Anesthesia Physical Anesthesia Plan  ASA: 2  Anesthesia Plan: General   Post-op Pain Management: Dilaudid  IV   Induction:   PONV Risk Score and Plan: 4 or greater and Treatment may vary due to age or medical condition, Midazolam , Ondansetron  and Dexamethasone   Airway Management Planned: Oral ETT  Additional Equipment: None  Intra-op Plan:   Post-operative Plan: Extubation in OR  Informed Consent: I have reviewed the patients History and Physical, chart, labs and discussed the procedure including the risks, benefits and alternatives for the proposed anesthesia with the patient or authorized representative who has indicated his/her understanding and acceptance.     Dental advisory given  Plan Discussed with: Anesthesiologist and CRNA  Anesthesia Plan Comments:          Anesthesia Quick Evaluation

## 2023-12-30 NOTE — Interval H&P Note (Signed)
 History and Physical Interval Note:  12/30/2023 10:34 AM  [name]  has presented today for surgery, with the diagnosis of Right tibial plateau fracture.  The various methods of treatment have been discussed with the patient and family. After consideration of risks, benefits and other options for treatment, the patient has consented to  Procedure(s): OPEN REDUCTION INTERNAL FIXATION (ORIF) TIBIAL PLATEAU (Right) as a surgical intervention.  The patient's history has been reviewed, patient examined, no change in status, stable for surgery.  I have reviewed the patient's chart and labs.  Questions were answered to the patient's satisfaction.     [name]

## 2023-12-30 NOTE — Consult Note (Signed)
 Reason for Consult:Right tibia plateau fx Referring Physician: Leona Rake Time called: 0732 Time at bedside: 0903   [name] is an 68 y.o. female.  HPI: Any was at work when she slipped on a dip in the floor and fell. She had immediate right knee pain. She was able to get up but not able to bear weight and was brought to the ED. X-rays showed a tibia plateau fx and orthopedic surgery was consulted. She lives at home with her husband, does not use any assistive devices, and works as a Ship broker.  Past Medical History:  Diagnosis Date   Anemia    Arthritis    Chronic pain    Headache    "believe it was gluten related"   History of gallstones    History of pneumonia    History of tuberculosis    Hypertension    Numbness and tingling    PONV (postoperative nausea and vomiting)    Prolonged QT interval    Wears glasses     Past Surgical History:  Procedure Laterality Date   ANTERIOR CERVICAL DECOMP/DISCECTOMY FUSION  02/2016   ANTERIOR CERVICAL DECOMP/DISCECTOMY FUSION N/A 02/19/2016   Procedure: C3-4, C4-5 Anteior Cervical Discectomy and Fusion, Allograft, Plate;  Surgeon: Adah Acron, MD;  Location: [facility] OR;  Service: Orthopedics;  Laterality: N/A;   CARPAL TUNNEL RELEASE Right 02/19/2016   Procedure: Right Carpal Tunnel Release;  Surgeon: Adah Acron, MD;  Location: [facility] OR;  Service: Orthopedics;  Laterality: Right;   CHOLECYSTECTOMY     COLONOSCOPY     DILATATION & CURRETTAGE/HYSTEROSCOPY WITH RESECTOCOPE N/A 10/30/2013   Procedure: DILATATION & CURETTAGE/HYSTEROSCOPY WITH RESECTOCOPE;  Surgeon: Kandra Orn, MD;  Location: [facility] ORS;  Service: Gynecology;  Laterality: N/A;  1 hr.   ESOPHAGOGASTRODUODENOSCOPY      History reviewed. No pertinent family history.  Social History:  reports that she has never smoked. She has never used smokeless tobacco. She reports current alcohol use. She reports that she does not use drugs.  Allergies:  Allergies   Allergen Reactions   Keflex [Cephalexin] Nausea And Vomiting and Other (See Comments)    Headaches Pt. Says she CAN tolerate Pcn & Amoxicillin   Fentanyl  Nausea And Vomiting and Other (See Comments)    Headache, n/v    Dairy Aid [Tilactase] Rash   Demerol [Meperidine] Nausea And Vomiting   Gluten Meal Other (See Comments)    headaches   Milk-Related Compounds Rash   Morphine And Codeine Nausea And Vomiting and Other (See Comments)    headache   Percocet [Oxycodone -Acetaminophen ] Nausea And Vomiting    Medications: I have reviewed the patient's current medications.  Results for orders placed or performed during the hospital encounter of 12/29/23 (from the past 48 hours)  Basic metabolic panel     Status: None   Collection Time: 12/29/23  5:13 PM  Result Value Ref Range   Sodium 142 135 - 145 mmol/L   Potassium 3.7 3.5 - 5.1 mmol/L   Chloride 104 98 - 111 mmol/L   CO2 26 22 - 32 mmol/L   Glucose, Bld 87 70 - 99 mg/dL    Comment: Glucose reference range applies only to samples taken after fasting for at least 8 hours.   BUN 10 8 - 23 mg/dL   Creatinine, Ser 9.60 0.44 - 1.00 mg/dL   Calcium 9.7 8.9 - 45.4 mg/dL   GFR, Estimated >09 >81 mL/min    Comment: (NOTE) Calculated using the CKD-EPI  Creatinine Equation (2021)    Anion gap 12 5 - 15    Comment: Performed at [facility], [address], [place], [place] [address]  CBC with Differential     Status: None   Collection Time: 12/29/23  5:13 PM  Result Value Ref Range   WBC 8.0 4.0 - 10.5 K/uL   RBC 4.54 3.87 - 5.11 MIL/uL   Hemoglobin 13.7 12.0 - 15.0 g/dL   HCT 95.1 88.4 - 16.6 %   MCV 91.0 80.0 - 100.0 fL   MCH 30.2 26.0 - 34.0 pg   MCHC 33.2 30.0 - 36.0 g/dL   RDW 06.3 01.6 - 01.0 %   Platelets 235 150 - 400 K/uL   nRBC 0.0 0.0 - 0.2 %   Neutrophils Relative % 61 %   Neutro Abs 4.8 1.7 - 7.7 K/uL   Lymphocytes Relative 27 %   Lymphs Abs 2.2 0.7 - 4.0 K/uL   Monocytes Relative 8 %    Monocytes Absolute 0.6 0.1 - 1.0 K/uL   Eosinophils Relative 4 %   Eosinophils Absolute 0.3 0.0 - 0.5 K/uL   Basophils Relative 0 %   Basophils Absolute 0.0 0.0 - 0.1 K/uL   Immature Granulocytes 0 %   Abs Immature Granulocytes 0.02 0.00 - 0.07 K/uL    Comment: Performed at [facility], [address], [place], [place] [address]  Surgical PCR screen     Status: None   Collection Time: 12/30/23  3:01 AM   Specimen: Nasal Mucosa; Nasal Swab  Result Value Ref Range   MRSA, PCR NEGATIVE NEGATIVE   Staphylococcus aureus NEGATIVE NEGATIVE    Comment: (NOTE) The Xpert SA Assay (FDA approved for NASAL specimens in patients 10 years of age and older), is one component of a comprehensive surveillance program. It is not intended to diagnose infection nor to guide or monitor treatment. Performed at [facility], [address] [place], [place] [address]   Comprehensive metabolic panel     Status: Abnormal   Collection Time: 12/30/23  6:19 AM  Result Value Ref Range   Sodium 139 135 - 145 mmol/L   Potassium 3.4 (L) 3.5 - 5.1 mmol/L   Chloride 103 98 - 111 mmol/L   CO2 25 22 - 32 mmol/L   Glucose, Bld 94 70 - 99 mg/dL    Comment: Glucose reference range applies only to samples taken after fasting for at least 8 hours.   BUN 11 8 - 23 mg/dL   Creatinine, Ser 0.25 0.44 - 1.00 mg/dL   Calcium 8.9 8.9 - 42.7 mg/dL   Total Protein 6.7 6.5 - 8.1 g/dL   Albumin 3.6 3.5 - 5.0 g/dL   AST 27 15 - 41 U/L   ALT 30 0 - 44 U/L   Alkaline Phosphatase 70 38 - 126 U/L   Total Bilirubin 0.6 0.0 - 1.2 mg/dL   GFR, Estimated >06 >23 mL/min    Comment: (NOTE) Calculated using the CKD-EPI Creatinine Equation (2021)    Anion gap 11 5 - 15    Comment: Performed at [facility], [address] [place], [place] [address]  Magnesium      Status: None   Collection Time: 12/30/23  6:19 AM  Result Value Ref Range   Magnesium  2.2 1.7 - 2.4 mg/dL    Comment: Performed at [facility], [address] [place], [place] [address]  Phosphorus     Status: None  Collection Time: 12/30/23  6:19 AM  Result Value Ref Range   Phosphorus 4.0 2.5 - 4.6 mg/dL    Comment: Performed at [facility], [address] [place], [place] [address]  CBC with Differential/Platelet     Status: None   Collection Time: 12/30/23  6:19 AM  Result Value Ref Range   WBC 7.6 4.0 - 10.5 K/uL   RBC 4.30 3.87 - 5.11 MIL/uL   Hemoglobin 13.0 12.0 - 15.0 g/dL   HCT 60.4 54.0 - 98.1 %   MCV 93.0 80.0 - 100.0 fL   MCH 30.2 26.0 - 34.0 pg   MCHC 32.5 30.0 - 36.0 g/dL   RDW 19.1 47.8 - 29.5 %   Platelets 237 150 - 400 K/uL   nRBC 0.0 0.0 - 0.2 %   Neutrophils Relative % 60 %   Neutro Abs 4.6 1.7 - 7.7 K/uL   Lymphocytes Relative 27 %   Lymphs Abs 2.0 0.7 - 4.0 K/uL   Monocytes Relative 8 %   Monocytes Absolute 0.6 0.1 - 1.0 K/uL   Eosinophils Relative 4 %   Eosinophils Absolute 0.3 0.0 - 0.5 K/uL   Basophils Relative 1 %   Basophils Absolute 0.0 0.0 - 0.1 K/uL   Immature Granulocytes 0 %   Abs Immature Granulocytes 0.02 0.00 - 0.07 K/uL    Comment: Performed at [facility], [address], [place], [place] [address]  Hemoglobin A1c     Status: None   Collection Time: 12/30/23  6:19 AM  Result Value Ref Range   Hgb A1c MFr Bld 5.4 4.8 - 5.6 %    Comment: (NOTE) Pre diabetes:          5.7%-6.4%  Diabetes:              >6.4%  Glycemic control for   <7.0% adults with diabetes    Mean Plasma Glucose 108.28 mg/dL    Comment: Performed at [facility], [address], [place], [place] [address]  Comprehensive metabolic panel     Status: Abnormal   Collection Time: 12/30/23  6:21 AM  Result Value Ref Range   Sodium 140 135 - 145 mmol/L   Potassium 3.4 (L) 3.5 - 5.1 mmol/L   Chloride 104 98 - 111 mmol/L   CO2 25 22 - 32 mmol/L   Glucose, Bld 95 70 - 99 mg/dL    Comment: Glucose reference range applies only to samples taken after fasting for at least 8 hours.    BUN 11 8 - 23 mg/dL   Creatinine, Ser 4.69 0.44 - 1.00 mg/dL   Calcium 9.0 8.9 - 62.9 mg/dL   Total Protein 6.8 6.5 - 8.1 g/dL   Albumin 3.5 3.5 - 5.0 g/dL   AST 26 15 - 41 U/L   ALT 30 0 - 44 U/L   Alkaline Phosphatase 70 38 - 126 U/L   Total Bilirubin 0.7 0.0 - 1.2 mg/dL   GFR, Estimated >52 >84 mL/min    Comment: (NOTE) Calculated using the CKD-EPI Creatinine Equation (2021)    Anion gap 11 5 - 15    Comment: Performed at [facility], [address] [place], [place] [address]    CT Knee Right Wo Contrast Result Date: 12/29/2023 CLINICAL DATA:  Knee trauma. Tibial plateau fracture. Mechanical fall yesterday at work. EXAM: CT OF THE RIGHT KNEE WITHOUT CONTRAST TECHNIQUE: Multidetector CT imaging of the right knee was performed according to the  standard protocol. Multiplanar CT image reconstructions were also generated. RADIATION DOSE REDUCTION: This exam was performed according to the departmental dose-optimization program which includes automated exposure control, adjustment of the mA and/or kV according to patient size and/or use of iterative reconstruction technique. COMPARISON:  Right knee radiographs 12/29/2023 FINDINGS: Bones/Joint/Cartilage Trauma there is there is depression of the lateral tibial plateau in a region measuring up to approximately 20 mm in transverse dimension and 18 mm in AP dimension (coronal series 6, image 41 and sagittal series 7, image 35) with up to approximately 7 mm cortical depression, greatest laterally. This is consistent with a Schatzker III lateral depression fracture. No medial tibial plateau fracture is seen. Minimal superior patellar degenerative spurring. The patellar apex is approximately 5 mm lateral to the trochlear notch, with overall mild lateralization of the patella. There is mild angulation with a short horizontal "shelf" measuring up to 7 mm in AP dimension at the posterior aspect of the proximal tibial epiphysis/physis region of the  medial tibial plateau (sagittal series 7, image 53) without an acute fracture line identified. This likely is chronic. Ligaments Suboptimally assessed by CT. There is a 5 mm chronic oval ossicle overlying the proximal anterior aspect of the medial collateral ligament, likely the sequela of remote trauma. Ligament is visualized and no full-thickness tear is seen. Muscles and Tendons The mild tibial plateau fracture extends into the anterolateral aspect of the proximal tibia including Gerdy's tubercle at the iliotibial band tendon insertion. There is mild thickening and surrounding stranding in the region of the iliotibial band insertion (axial images 55 through 67 in this region, indicating at least a sprain and possible partial-thickness tear at the insertion. Soft tissues Moderate joint effusion with mildly increased density measuring up to 26 Hounsfield units, likely a hemarthrosis. There is mild subcutaneous fat edema and swelling of the lateral and anterior knee. Dense atherosclerotic calcifications. IMPRESSION: 1. Schatzker III lateral depression fracture of the lateral tibial plateau with up to approximately 7 mm cortical depression, greatest laterally. 2. The lateral tibial plateau fracture extends into the anterolateral aspect of the proximal tibia including Gerdy's tubercle at the iliotibial band tendon insertion. There is mild thickening and surrounding stranding in the region of the iliotibial band insertion, indicating at least a sprain and possible partial-thickness tear at the insertion. 3. Moderate joint effusion with mildly increased density, likely a hemarthrosis. Electronically Signed   By: Bertina Broccoli M.D.   On: 12/29/2023 15:44   DG Knee 2 Views Right Result Date: 12/29/2023 CLINICAL DATA:  Right knee pain after fall yesterday. EXAM: RIGHT KNEE - 1-2 VIEW COMPARISON:  None Available. FINDINGS: No evidence of fracture, dislocation, or joint effusion. No evidence of arthropathy or other focal  bone abnormality. Soft tissues are unremarkable. IMPRESSION: Negative. Electronically Signed   By: Rosalene Colon M.D.   On: 12/29/2023 14:44    Review of Systems  HENT:  Negative for ear discharge, ear pain, hearing loss and tinnitus.   Eyes:  Negative for photophobia and pain.  Respiratory:  Negative for cough and shortness of breath.   Cardiovascular:  Negative for chest pain.  Gastrointestinal:  Negative for abdominal pain, nausea and vomiting.  Genitourinary:  Negative for dysuria, flank pain, frequency and urgency.  Musculoskeletal:  Positive for arthralgias (Right knee). Negative for back pain, myalgias and neck pain.  Neurological:  Negative for dizziness and headaches.  Hematological:  Does not bruise/bleed easily.  Psychiatric/Behavioral:  The patient is not nervous/anxious.  Blood pressure 119/78, pulse (!) 103, temperature 98 F (36.7 C), temperature source Oral, resp. rate 18, height 5\' 4"  (1.626 m), weight 85.3 kg, SpO2 99%. Physical Exam Constitutional:      General: She is not in acute distress.    Appearance: She is well-developed. She is not diaphoretic.  HENT:     Head: Normocephalic and atraumatic.  Eyes:     General: No scleral icterus.       Right eye: No discharge.        Left eye: No discharge.     Conjunctiva/sclera: Conjunctivae normal.  Cardiovascular:     Rate and Rhythm: Normal rate and regular rhythm.  Pulmonary:     Effort: Pulmonary effort is normal. No respiratory distress.  Musculoskeletal:     Cervical back: Normal range of motion.     Comments: RLE No traumatic wounds, ecchymosis, or rash  Mod TTP knee  No ankle effusion  Sens DPN, SPN, TN intact  Motor EHL, ext, flex, evers 5/5  DP 2+, PT 2+, No significant edema  Skin:    General: Skin is warm and dry.  Neurological:     Mental Status: She is alert.  Psychiatric:        Mood and Affect: Mood normal.        Behavior: Behavior normal.     Assessment/Plan: Right tibia plateau fx  -- Plan ORIF today with Dr. Curtiss Dowdy. Please keep NPO. Multiple medical problems including Anemia, Arthritis, Chronic pain , HTN , history of prolonged QT -- per primary service    Georganna Kin, PA-C Orthopedic Surgery [phone] 12/30/2023, 9:25 AM

## 2023-12-30 NOTE — Op Note (Signed)
 Orthopaedic Surgery Operative Note (CSN: [id number] ) Date of Surgery: 12/30/2023  Admit Date: 12/29/2023   Diagnoses: Pre-Op Diagnoses: Right lateral tibial plateau fracture  Post-Op Diagnosis: Same  Procedures: CPT 27535-Open reduction internal fixation of right lateral tibial plateau fracture  Surgeons : Primary: Laneta Pintos, MD  Assistant: Alona Jamaica, PA-C  Location: OR 3   Anesthesia: General   Antibiotics: Ancef 2g preop with 1 gm vancomycin  powder placed topically   Tourniquet time:  Total Tourniquet Time Documented: Thigh (Right) - 43 minutes Total: Thigh (Right) - 43 minutes  Estimated Blood Loss: 20 mL  Complications:* No complications entered in OR log *   Specimens:* No specimens in log *   Implants: Implant Name Type Inv. Item Serial No. Manufacturer Lot No. LRB No. Used Action  PLATE PROX TIBIA RT 4H - [id number] Plate PLATE PROX TIBIA RT 4H  [employer]  Right 1 Implanted  SCREW LOCKING VA 3.5X75MM - [id number] Screw SCREW LOCKING VA 3.5X75MM  [employer]  Right 2 Implanted  SCREW LOCK CORT ST 3.5X40 - [id number] Screw SCREW LOCK CORT ST 3.5X40  [employer]  Right 1 Implanted  SCREW CORT 3.5X46M SELF TAP - [id number] Screw SCREW CORT 3.5X46M SELF TAP  [employer]  Right 1 Implanted  SCREW CORTEX 3.5 SELF TAP - [id number] Screw SCREW CORTEX 3.5 SELF TAP  [employer]  Right 1 Implanted  SCREW CORTEX 3.5X75MM - [id number] Screw SCREW CORTEX 3.5X75MM  [employer]  Right 1 Implanted     Indications for Surgery: 68 year old female who sustained a ground-level fall with a right lateral plateau fracture with significant joint depression.  Due to the unstable nature of her injury I recommend proceeding with open reduction internal fixation.  Risk and benefits were discussed with the patient.  Risks include but not limited to bleeding, infection, malunion, nonunion, hardware failure, hardware rotation, nerve  and blood vessel injury, posttraumatic arthritis, knee stiffness, DVT, and the possibility anesthetic complications.  She agreed to proceed with surgery and consent was obtained.  Operative Findings: 1.  Open reduction internal fixation of right lateral tibial plateau fracture using Synthes VA 3.5 mm proximal tibial locking plate. 2.  No meniscus tear apparent on submeniscal arthrotomy.  Procedure: The patient was identified in the preoperative holding area. Consent was confirmed with the patient and their family and all questions were answered. The operative extremity was marked after confirmation with the patient. she was then brought back to the operating room by our anesthesia colleagues.  She was placed under general anesthetic and carefully transferred over to radiolucent flattop table.  A nonsterile tourniquet was placed to her upper thigh.  A bump was placed under her operative hip.  The right lower extremity was then prepped and draped in usual sterile fashion.  A timeout was performed to verify the patient, the procedure, and the extremity.  Preoperative antibiotics were dosed.  Fluoroscopic imaging was obtained to show the unstable nature of her injury.  The hip and knee was flexed over a triangle and the tourniquet was inflated to 250 mmHg.  Total tourniquet time was noted above.  A lateral parapatellar approach was made and carried down through skin and subcutaneous tissue.  I incised through the IT band and released it off the lateral condyle.  I developed the interval between the IT band and the lateral capsule.  I released the IT band until I could palpate the fibular head.  I then performed a submeniscal arthrotomy and tagged  the capsule for later repair with 0 Vicryl suture.  There was no meniscus tear but I was able to visualize the articular impaction.  I was able to enter an anterior lateral split with a Cobb elevator and was able to elevate the articular impaction into anatomic  reduction.  I then provisionally held it in position with 1.6 mm K wires.  I then reduced the lateral split with a reduction tenaculum and placed a 3.5 millimeter screw to hold this condylar reduction.  I then reinforced the reduction with another K wire for the articular surface.  I then chose a Synthes 3.5 mm VA proximal tibial locking plate and slid this submuscularly along the lateral cortex of the tibia.  I placed a nonlocking screw proximally to bring the plate flush to bone.  I then percutaneously placed 3.5 millimeter screws into the tibial shaft.  I then returned to the proximal segment and proceeded to place 3.5 mm locking screws to raft the articular surface.  The K wires were removed and final fluoroscopic imaging was obtained which showed anatomic reduction of the lateral joint.  I was able to visualize that it was anatomic as well.  The incision was copiously irrigated.  A free needle was used to bring the 0 Vicryl sutures through the plate and tied these down for capsular repair.  I then placed a gram of vancomycin  powder.  The IT band was closed with 0 Vicryl, the skin was closed with 2-0 Monocryl and 3-0 Monocryl.  Dermabond was used to seal the skin.  Sterile dressings were applied.  The patient was awoken from anesthesia and taken to the PACU in stable condition.  Post Op Plan/Instructions: The patient will be nonweightbearing to the right lower extremity.  She will receive postoperative Ancef.  She will receive Lovenox  for DVT prophylaxis and discharged on 81 mg of aspirin.  We will have her mobilize with physical and Occupational Therapy.  She will have no range of motion restrictions and no need for immobilization.  I was present and performed the entire surgery.  Alona Jamaica, PA-C did assist me throughout the case. An assistant was necessary given the difficulty in approach, maintenance of reduction and ability to instrument the fracture.   Katheryne Pane, MD [facility]

## 2023-12-30 NOTE — Transfer of Care (Signed)
 Immediate Anesthesia Transfer of Care Note  Patient: [name]  Procedure(s) Performed: OPEN REDUCTION INTERNAL FIXATION TIBIAL PLATEAU (Right: Leg Lower)  Patient Location: PACU  Anesthesia Type:General  Level of Consciousness: drowsy and patient cooperative  Airway & Oxygen Therapy: Patient Spontanous Breathing and Patient connected to nasal cannula oxygen  Post-op Assessment: Report given to RN, Post -op Vital signs reviewed and stable, and Patient moving all extremities X 4  Post vital signs: Reviewed and stable  Last Vitals:  Vitals Value Taken Time  BP 116/73 12/30/23 1220  Temp    Pulse 102 12/30/23 1226  Resp 17 12/30/23 1226  SpO2 92% 12/30/23 1226  Vitals shown include unfiled device data.  Last Pain:  Vitals:   12/30/23 1039  TempSrc:   PainSc: 0-No pain         Complications: No notable events documented.

## 2023-12-30 NOTE — Plan of Care (Signed)

## 2023-12-30 NOTE — Progress Notes (Signed)
 PT Cancellation Note  Patient Details Name: [name] MRN: [id number] DOB: [birth year]   Cancelled Treatment:    Reason Eval/Treat Not Completed: Patient at procedure or test/unavailable - plan for R tibial ORIF today, will check back post-op.   Lara Palinkas S, PT DPT Acute Rehabilitation Services Secure Chat Preferred  Office [phone]    Ilyse Tremain Cydney Draft 12/30/2023, 8:05 AM

## 2023-12-30 NOTE — H&P (Addendum)
 History and Physical    [name] ZOX:[id number] DOB: [birth year] DOA: 12/29/2023  PCP: Jonathon Neighbors, MD  Patient coming from: work  I have personally briefly reviewed patient's old medical records in [facility]  Chief Complaint:  right knee pain s/p fall at work  HPI: [name] is a 68 y.o. female with medical history significant of  Anemia, Arthritis, Chronic pain , HTN , history of prolonged QT who presents to ED s/p mechanical fall at work with compliant of right knee pain. Patient denies fever, chills/ n/v/d/abdominal pain/ presyncope or chest pain .  ED Course:  Vitals: afeb, bp 126/90, hr 104, rr 16, sat 99%   CTH IMPRESSION: 1. Schatzker III lateral depression fracture of the lateral tibial plateau with up to approximately 7 mm cortical depression, greatest laterally. 2. The lateral tibial plateau fracture extends into the anterolateral aspect of the proximal tibia including Gerdy's tubercle at the iliotibial band tendon insertion. There is mild thickening and surrounding stranding in the region of the iliotibial band insertion, indicating at least a sprain and possible partial-thickness tear at the insertion. 3. Moderate joint effusion with mildly increased density, likely a hemarthrosis.    labs Wbc 8, hgb 13.7, plt235, Na 142, K 3.7, CL104, bicarb 26, cr 0.54 Tx fentanyl ,ketoralc,tramadol   Review of Systems: As per HPI otherwise 10 point review of systems negative.   Past Medical History:  Diagnosis Date   Anemia    Arthritis    Chronic pain    Headache    "believe it was gluten related"   History of gallstones    History of pneumonia    History of tuberculosis    Hypertension    Numbness and tingling    PONV (postoperative nausea and vomiting)    Prolonged QT interval    Wears glasses     Past Surgical History:  Procedure Laterality Date   ANTERIOR CERVICAL DECOMP/DISCECTOMY FUSION  02/2016   ANTERIOR CERVICAL  DECOMP/DISCECTOMY FUSION N/A 02/19/2016   Procedure: C3-4, C4-5 Anteior Cervical Discectomy and Fusion, Allograft, Plate;  Surgeon: Adah Acron, MD;  Location: [facility] OR;  Service: Orthopedics;  Laterality: N/A;   CARPAL TUNNEL RELEASE Right 02/19/2016   Procedure: Right Carpal Tunnel Release;  Surgeon: Adah Acron, MD;  Location: [facility] OR;  Service: Orthopedics;  Laterality: Right;   CHOLECYSTECTOMY     COLONOSCOPY     DILATATION & CURRETTAGE/HYSTEROSCOPY WITH RESECTOCOPE N/A 10/30/2013   Procedure: DILATATION & CURETTAGE/HYSTEROSCOPY WITH RESECTOCOPE;  Surgeon: Kandra Orn, MD;  Location: [facility] ORS;  Service: Gynecology;  Laterality: N/A;  1 hr.   ESOPHAGOGASTRODUODENOSCOPY       reports that she has never smoked. She has never used smokeless tobacco. She reports current alcohol use. She reports that she does not use drugs.  Allergies  Allergen Reactions   Keflex [Cephalexin] Nausea And Vomiting and Other (See Comments)    Headaches Pt. Says she CAN tolerate Pcn & Amoxicillin   Fentanyl  Nausea And Vomiting and Other (See Comments)    Headache, n/v    Dairy Aid [Tilactase] Rash   Demerol [Meperidine] Nausea And Vomiting   Gluten Meal Other (See Comments)    headaches   Milk-Related Compounds Rash   Morphine And Codeine Nausea And Vomiting and Other (See Comments)    headache   Percocet [Oxycodone -Acetaminophen ] Nausea And Vomiting    History reviewed. No pertinent family history.  Prior to Admission medications   Medication Sig Start Date End Date Taking? Authorizing Provider  amLODipine -valsartan  (EXFORGE ) 10-160 MG tablet Take 1 tablet by mouth daily. 12/27/15   [provider]  cyclobenzaprine (FLEXERIL) 10 MG tablet  08/24/16   [provider]  GOLDEN SEAL PO Take 222 mg by mouth daily. Golden Seal liquid - 222mg /ml    [provider]  magnesium  oxide (MAG-OX) 400 MG tablet Take 400 mg by mouth daily.     [provider]  Melatonin 10 MG  TABS Take 10 mg by mouth at bedtime as needed (sleep).    [provider]  methocarbamol  (ROBAXIN ) 500 MG tablet Take 1 tablet (500 mg total) by mouth every 6 (six) hours as needed for muscle spasms. 02/20/16   Verlan Glazier, PA-C  ondansetron  (ZOFRAN  ODT) 4 MG disintegrating tablet Take 1 tablet (4 mg total) by mouth every 8 (eight) hours as needed. 02/20/16   Verlan Glazier, PA-C  Potassium 99 MG TABS Take 99 mg by mouth at bedtime as needed (leg cramps).    [provider]  traMADol  (ULTRAM ) 50 MG tablet Take 1 tablet (50 mg total) by mouth every 6 (six) hours as needed for moderate pain. 02/20/16   Adah Acron, MD    Physical Exam: Vitals:   12/29/23 2130 12/29/23 2215 12/30/23 0030 12/30/23 0100  BP: 132/86 110/60 103/73 112/81  Pulse: (!) 101 (!) 103 94 100  Resp:  17  18  Temp:  98 F (36.7 C)  98.2 F (36.8 C)  TempSrc:      SpO2: 96% 95% 94% 97%  Weight:      Height:        Constitutional: NAD, calm, comfortable Vitals:   12/29/23 2130 12/29/23 2215 12/30/23 0030 12/30/23 0100  BP: 132/86 110/60 103/73 112/81  Pulse: (!) 101 (!) 103 94 100  Resp:  17  18  Temp:  98 F (36.7 C)  98.2 F (36.8 C)  TempSrc:      SpO2: 96% 95% 94% 97%  Weight:      Height:       Eyes: lids and conjunctivae normal Neck: normal, supple, no masses, no thyromegaly Respiratory: clear to auscultation bilaterally, no wheezing, no crackles. Normal respiratory effort. No accessory muscle use.  Cardiovascular: Regular rate and rhythm, no murmurs / rubs / gallops. No extremity edema. Warm extremities  Abdomen: no tenderness, no masses palpated. No hepatosplenomegaly. Bowel sounds positive.  Musculoskeletal: no clubbing / cyanosis. No joint deformity upper and lower extremities. Good ROM, no contractures. Normal muscle tone. Swollen right knee and lower leg  compared to left lower extremity  Skin: no rashes, lesions, ulcers. No induration Neurologic: CN 2-12 grossly intact.  Sensation intact,   MAE x 4 , except right lower ext due to pain  Psychiatric: Normal judgment and insight. Alert and oriented x 3. Normal mood.    Labs on Admission: I have personally reviewed following labs and imaging studies  CBC: Recent Labs  Lab 12/29/23 1713  WBC 8.0  NEUTROABS 4.8  HGB 13.7  HCT 41.3  MCV 91.0  PLT 235   Basic Metabolic Panel: Recent Labs  Lab 12/29/23 1713  NA 142  K 3.7  CL 104  CO2 26  GLUCOSE 87  BUN 10  CREATININE 0.54  CALCIUM 9.7   GFR: Estimated Creatinine Clearance: 74 mL/min (by C-G formula based on SCr of 0.54 mg/dL). Liver Function Tests: No results for input(s): "AST", "ALT", "ALKPHOS", "BILITOT", "PROT", "ALBUMIN" in the last 168 hours. No results for input(s): "LIPASE", "AMYLASE"  in the last 168 hours. No results for input(s): "AMMONIA" in the last 168 hours. Coagulation Profile: No results for input(s): "INR", "PROTIME" in the last 168 hours. Cardiac Enzymes: No results for input(s): "CKTOTAL", "CKMB", "CKMBINDEX", "TROPONINI" in the last 168 hours. BNP (last 3 results) No results for input(s): "PROBNP" in the last 8760 hours. HbA1C: No results for input(s): "HGBA1C" in the last 72 hours. CBG: No results for input(s): "GLUCAP" in the last 168 hours. Lipid Profile: No results for input(s): "CHOL", "HDL", "LDLCALC", "TRIG", "CHOLHDL", "LDLDIRECT" in the last 72 hours. Thyroid Function Tests: No results for input(s): "TSH", "T4TOTAL", "FREET4", "T3FREE", "THYROIDAB" in the last 72 hours. Anemia Panel: No results for input(s): "VITAMINB12", "FOLATE", "FERRITIN", "TIBC", "IRON", "RETICCTPCT" in the last 72 hours. Urine analysis:    Component Value Date/Time   COLORURINE YELLOW 02/11/2016 0837   APPEARANCEUR CLEAR 02/11/2016 0837   LABSPEC 1.015 02/11/2016 0837   PHURINE 8.0 02/11/2016 0837   GLUCOSEU NEGATIVE 02/11/2016 0837   HGBUR NEGATIVE 02/11/2016 0837   BILIRUBINUR NEGATIVE 02/11/2016 0837   KETONESUR NEGATIVE  02/11/2016 0837   PROTEINUR NEGATIVE 02/11/2016 0837   NITRITE NEGATIVE 02/11/2016 0837   LEUKOCYTESUR NEGATIVE 02/11/2016 0837    Radiological Exams on Admission: CT Knee Right Wo Contrast Result Date: 12/29/2023 CLINICAL DATA:  Knee trauma. Tibial plateau fracture. Mechanical fall yesterday at work. EXAM: CT OF THE RIGHT KNEE WITHOUT CONTRAST TECHNIQUE: Multidetector CT imaging of the right knee was performed according to the standard protocol. Multiplanar CT image reconstructions were also generated. RADIATION DOSE REDUCTION: This exam was performed according to the departmental dose-optimization program which includes automated exposure control, adjustment of the mA and/or kV according to patient size and/or use of iterative reconstruction technique. COMPARISON:  Right knee radiographs 12/29/2023 FINDINGS: Bones/Joint/Cartilage Trauma there is there is depression of the lateral tibial plateau in a region measuring up to approximately 20 mm in transverse dimension and 18 mm in AP dimension (coronal series 6, image 41 and sagittal series 7, image 35) with up to approximately 7 mm cortical depression, greatest laterally. This is consistent with a Schatzker III lateral depression fracture. No medial tibial plateau fracture is seen. Minimal superior patellar degenerative spurring. The patellar apex is approximately 5 mm lateral to the trochlear notch, with overall mild lateralization of the patella. There is mild angulation with a short horizontal "shelf" measuring up to 7 mm in AP dimension at the posterior aspect of the proximal tibial epiphysis/physis region of the medial tibial plateau (sagittal series 7, image 53) without an acute fracture line identified. This likely is chronic. Ligaments Suboptimally assessed by CT. There is a 5 mm chronic oval ossicle overlying the proximal anterior aspect of the medial collateral ligament, likely the sequela of remote trauma. Ligament is visualized and no  full-thickness tear is seen. Muscles and Tendons The mild tibial plateau fracture extends into the anterolateral aspect of the proximal tibia including Gerdy's tubercle at the iliotibial band tendon insertion. There is mild thickening and surrounding stranding in the region of the iliotibial band insertion (axial images 55 through 67 in this region, indicating at least a sprain and possible partial-thickness tear at the insertion. Soft tissues Moderate joint effusion with mildly increased density measuring up to 26 Hounsfield units, likely a hemarthrosis. There is mild subcutaneous fat edema and swelling of the lateral and anterior knee. Dense atherosclerotic calcifications. IMPRESSION: 1. Schatzker III lateral depression fracture of the lateral tibial plateau with up to approximately 7 mm cortical depression, greatest laterally.  2. The lateral tibial plateau fracture extends into the anterolateral aspect of the proximal tibia including Gerdy's tubercle at the iliotibial band tendon insertion. There is mild thickening and surrounding stranding in the region of the iliotibial band insertion, indicating at least a sprain and possible partial-thickness tear at the insertion. 3. Moderate joint effusion with mildly increased density, likely a hemarthrosis. Electronically Signed   By: Bertina Broccoli M.D.   On: 12/29/2023 15:44   DG Knee 2 Views Right Result Date: 12/29/2023 CLINICAL DATA:  Right knee pain after fall yesterday. EXAM: RIGHT KNEE - 1-2 VIEW COMPARISON:  None Available. FINDINGS: No evidence of fracture, dislocation, or joint effusion. No evidence of arthropathy or other focal bone abnormality. Soft tissues are unremarkable. IMPRESSION: Negative. Electronically Signed   By: Rosalene Colon M.D.   On: 12/29/2023 14:44    EKG: Independently reviewed. pending  Assessment/Plan Right lateral tibial plateau fracture  -admit to med/surg - supportive care with pain medications  - npo midnight /for planned  repair in am - f/u with orthopedics for further recs  Chronic pain  -resume home regimen as able   Essential Hypertension  -currently stable  -resume home regimen   History of prolonged QT -EKG pending  - lytes stable  -mag pending    Patient is medically stable and cleared for proposed surgery.  DVT prophylaxis: heparin  Code Status: full/ as discussed per patient wishes in event of cardiac arrest  Family Communication: none at bedside Disposition Plan: patient  expected to be admitted greater than 2 midnights  Consults called: Orthopedics ( Dr Hiram Lukes and PA Julius Ohs ) epic message sent Admission status: med surg   Sabas Cradle MD [facility]   If 7PM-7AM, please contact night-coverage [email or web address] Password [email or web address]  12/30/2023, 2:11 AM

## 2023-12-31 ENCOUNTER — Encounter (HOSPITAL_COMMUNITY): Payer: Self-pay | Admitting: Student

## 2023-12-31 DIAGNOSIS — S89091D Other physeal fracture of upper end of right tibia, subsequent encounter for fracture with routine healing: Secondary | ICD-10-CM | POA: Diagnosis not present

## 2023-12-31 LAB — CBC
HCT: 37.6 % (ref 36.0–46.0)
Hemoglobin: 12.6 g/dL (ref 12.0–15.0)
MCH: 30.6 pg (ref 26.0–34.0)
MCHC: 33.5 g/dL (ref 30.0–36.0)
MCV: 91.3 fL (ref 80.0–100.0)
Platelets: 237 10*3/uL (ref 150–400)
RBC: 4.12 MIL/uL (ref 3.87–5.11)
RDW: 12.8 % (ref 11.5–15.5)
WBC: 11.6 10*3/uL — ABNORMAL HIGH (ref 4.0–10.5)
nRBC: 0 % (ref 0.0–0.2)

## 2023-12-31 LAB — BASIC METABOLIC PANEL WITH GFR
Anion gap: 9 (ref 5–15)
BUN: 10 mg/dL (ref 8–23)
CO2: 24 mmol/L (ref 22–32)
Calcium: 8.6 mg/dL — ABNORMAL LOW (ref 8.9–10.3)
Chloride: 103 mmol/L (ref 98–111)
Creatinine, Ser: 0.54 mg/dL (ref 0.44–1.00)
GFR, Estimated: >60 mL/min (ref >60)
Glucose, Bld: 129 mg/dL — ABNORMAL HIGH (ref 70–99)
Potassium: 4.1 mmol/L (ref 3.5–5.1)
Sodium: 136 mmol/L (ref 135–145)

## 2023-12-31 MED ORDER — ONDANSETRON 4 MG PO TBDP: 4.0000 mg | ORAL_TABLET | Freq: Three times a day (TID) | ORAL | 0 refills | Status: AC | PRN

## 2023-12-31 MED ORDER — ASPIRIN 81 MG PO TBEC: 81.0000 mg | DELAYED_RELEASE_TABLET | Freq: Every day | ORAL | 0 refills | Status: AC

## 2023-12-31 MED ORDER — TRAMADOL HCL 50 MG PO TABS
50.0000 mg | ORAL_TABLET | Freq: Four times a day (QID) | ORAL | 0 refills | Status: AC | PRN
Start: 2023-12-31 — End: ?

## 2023-12-31 NOTE — Progress Notes (Signed)
 PROGRESS NOTE  [name]  OZH:[id number] DOB: 07/27/1956 DOA: 12/29/2023 PCP: Jonathon Neighbors, MD   Brief Narrative: Patient is a 68 year old female with history of anemia, arthritis, chronic pain syndrome, hypertension who presented to the emergency department after mechanical fall at work complaining of right knee pain.   she was hemodynamically stable on presentation.  CT hip showed right lateral tibial plateau fracture.Orthopedics consulted.  Status post ORIF of right lateral tibial plateau fracture on 4/24.  PT/OT recommending home health.  Plan for discharge tomorrow to allow 1 more PT session  Assessment & Plan:  Principal Problem:   Tibial fracture Active Problems:   Closed fracture of lateral portion of right tibial plateau    Right lateral tibia plateau fracture: Continue pain management, supportive care.  S/O ORIF.  PT/OT recommending home health on discharge.  Currently on Lovenox  for DVT prophylaxis  Hypertension: Currently blood pressure stable.  Continue current medications  History of prolonged QT:  Avoid QT prolonging medications.  EKG stable.  Monitor electrolytes intermittently  Hypokalemia: Supplemented with potassium and corrected        DVT prophylaxis:enoxaparin  (LOVENOX ) injection 40 mg Start: 12/31/23 1000 SCDs Start: 12/30/23 1323     Code Status: Full Code  Family Communication: None at the bedside  Patient status: Inpatient  Patient is from : Home  Anticipated discharge to: Home  Estimated DC date: Tomorrow   Consultants: Orthopedics  Procedures: ORIF  Antimicrobials:  Anti-infectives (From admission, onward)    Start     Dose/Rate Route Frequency Ordered Stop   12/30/23 2200  vancomycin  (VANCOCIN ) IVPB 1000 mg/200 mL premix        1,000 mg 200 mL/hr over 60 Minutes Intravenous Every 12 hours 12/30/23 1322 12/30/23 2259   12/30/23 1159  vancomycin  (VANCOCIN ) powder  Status:  Discontinued          As needed 12/30/23 1159  12/30/23 1216   12/30/23 1100  vancomycin  (VANCOCIN ) IVPB 1000 mg/200 mL premix        1,000 mg 200 mL/hr over 60 Minutes Intravenous On call to O.R. 12/30/23 1014 12/30/23 1154   12/30/23 1100  levofloxacin  (LEVAQUIN ) IVPB 500 mg        500 mg 100 mL/hr over 60 Minutes Intravenous On call to O.R. 12/30/23 1014 12/30/23 1233   12/30/23 1018  levofloxacin  (LEVAQUIN ) 500 MG/100ML IVPB       Note to Pharmacy: Delphine Fiedler: cabinet override      12/30/23 1018 12/30/23 1133       Subjective:  Patient seen and examined at bedside today.  Comfortably lying in bed.  She was about to work with physical therapy.  Pain well-controlled today.  Denies any new complaints  Objective: Vitals:   12/30/23 2001 12/30/23 2357 12/31/23 0300 12/31/23 0820  BP: 128/72 92/74 117/68 120/74  Pulse: (!) 132 (!) 108 96 97  Resp: 16 16 17 17   Temp: 97.6 F (36.4 C) 97.9 F (36.6 C) 97.8 F (36.6 C) 98.2 F (36.8 C)  TempSrc: Oral Oral Oral Oral  SpO2: 98% 96% 97% 98%  Weight:      Height:        Intake/Output Summary (Last 24 hours) at 12/31/2023 1144 Last data filed at 12/31/2023 0400 Gross per 24 hour  Intake 2621.18 ml  Output 320 ml  Net 2301.18 ml   Filed Weights   12/29/23 1138 12/30/23 1024  Weight: 85.3 kg 85.3 kg    Examination:  General exam: Overall comfortable, not in  distress, pleasant female HEENT: PERRL Respiratory system:  no wheezes or crackles  Cardiovascular system: S1 & S2 heard, RRR.  Gastrointestinal system: Abdomen is nondistended, soft and nontender. Central nervous system: Alert and oriented Extremities: Right lower extremity covered with dressing, no clubbing ,no cyanosis Skin: No rashes, no ulcers,no icterus     Data Reviewed: I have personally reviewed following labs and imaging studies  CBC: Recent Labs  Lab 12/29/23 1713 12/30/23 0619 12/31/23 0410  WBC 8.0 7.6 11.6*  NEUTROABS 4.8 4.6  --   HGB 13.7 13.0 12.6  HCT 41.3 40.0 37.6  MCV 91.0 93.0  91.3  PLT 235 237 237   Basic Metabolic Panel: Recent Labs  Lab 12/29/23 1713 12/30/23 0619 12/30/23 0621 12/31/23 0410  NA 142 139 140 136  K 3.7 3.4* 3.4* 4.1  CL 104 103 104 103  CO2 26 25 25 24   GLUCOSE 87 94 95 129*  BUN 10 11 11 10   CREATININE 0.54 0.54 0.48 0.54  CALCIUM 9.7 8.9 9.0 8.6*  MG  --  2.2  --   --   PHOS  --  4.0  --   --      Recent Results (from the past 240 hours)  Surgical PCR screen     Status: None   Collection Time: 12/30/23  3:01 AM   Specimen: Nasal Mucosa; Nasal Swab  Result Value Ref Range Status   MRSA, PCR NEGATIVE NEGATIVE Final   Staphylococcus aureus NEGATIVE NEGATIVE Final    Comment: (NOTE) The Xpert SA Assay (FDA approved for NASAL specimens in patients 64 years of age and older), is one component of a comprehensive surveillance program. It is not intended to diagnose infection nor to guide or monitor treatment. Performed at [facility], [address] [place], [place] [address]      Radiology Studies: DG Knee Right Port Result Date: 12/30/2023 CLINICAL DATA:  Fracture EXAM: PORTABLE RIGHT KNEE - 1-2 VIEW COMPARISON:  Right knee x-ray 12/30/2023 and 12/29/2023. FINDINGS: Lateral sideplate and screws are seen in the proximal tibia is fracture. Alignment is anatomic. No new fractures are seen. Soft tissue swelling and air is present compatible with recent surgery. Peripheral vascular calcifications are present. IMPRESSION: Status post ORIF of proximal tibia fracture. Electronically Signed   By: Tyron Gallon M.D.   On: 12/30/2023 16:55   DG Knee 1-2 Views Right Result Date: 12/30/2023 CLINICAL DATA:  Right tibial plateau ORIF EXAM: RIGHT KNEE - 1-2 VIEW COMPARISON:  Right knee radiographs 12/29/2023, CT right knee 12/29/2023 FINDINGS: Images were performed intraoperatively without the presence of a radiologist. The patient is undergoing lateral plate and screw fixation of the previously seen depressed lateral tibial plateau  Schatzker III fracture. No hardware complication is seen. Total fluoroscopy images: 4 Total fluoroscopy time: 31 seconds Total dose: Radiation Exposure Index (as provided by the fluoroscopic device): 1.98 mGy air Kerma Please see intraoperative findings for further detail. IMPRESSION: Intraoperative fluoroscopy for lateral tibial plateau ORIF. Electronically Signed   By: Bertina Broccoli M.D.   On: 12/30/2023 13:10   DG C-Arm 1-60 Min-No Report Result Date: 12/30/2023 Fluoroscopy was utilized by the requesting physician.  No radiographic interpretation.   CT Knee Right Wo Contrast Result Date: 12/29/2023 CLINICAL DATA:  Knee trauma. Tibial plateau fracture. Mechanical fall yesterday at work. EXAM: CT OF THE RIGHT KNEE WITHOUT CONTRAST TECHNIQUE: Multidetector CT imaging of the right knee was performed according to the standard protocol. Multiplanar CT image reconstructions were  also generated. RADIATION DOSE REDUCTION: This exam was performed according to the departmental dose-optimization program which includes automated exposure control, adjustment of the mA and/or kV according to patient size and/or use of iterative reconstruction technique. COMPARISON:  Right knee radiographs 12/29/2023 FINDINGS: Bones/Joint/Cartilage Trauma there is there is depression of the lateral tibial plateau in a region measuring up to approximately 20 mm in transverse dimension and 18 mm in AP dimension (coronal series 6, image 41 and sagittal series 7, image 35) with up to approximately 7 mm cortical depression, greatest laterally. This is consistent with a Schatzker III lateral depression fracture. No medial tibial plateau fracture is seen. Minimal superior patellar degenerative spurring. The patellar apex is approximately 5 mm lateral to the trochlear notch, with overall mild lateralization of the patella. There is mild angulation with a short horizontal "shelf" measuring up to 7 mm in AP dimension at the posterior aspect of the  proximal tibial epiphysis/physis region of the medial tibial plateau (sagittal series 7, image 53) without an acute fracture line identified. This likely is chronic. Ligaments Suboptimally assessed by CT. There is a 5 mm chronic oval ossicle overlying the proximal anterior aspect of the medial collateral ligament, likely the sequela of remote trauma. Ligament is visualized and no full-thickness tear is seen. Muscles and Tendons The mild tibial plateau fracture extends into the anterolateral aspect of the proximal tibia including Gerdy's tubercle at the iliotibial band tendon insertion. There is mild thickening and surrounding stranding in the region of the iliotibial band insertion (axial images 55 through 67 in this region, indicating at least a sprain and possible partial-thickness tear at the insertion. Soft tissues Moderate joint effusion with mildly increased density measuring up to 26 Hounsfield units, likely a hemarthrosis. There is mild subcutaneous fat edema and swelling of the lateral and anterior knee. Dense atherosclerotic calcifications. IMPRESSION: 1. Schatzker III lateral depression fracture of the lateral tibial plateau with up to approximately 7 mm cortical depression, greatest laterally. 2. The lateral tibial plateau fracture extends into the anterolateral aspect of the proximal tibia including Gerdy's tubercle at the iliotibial band tendon insertion. There is mild thickening and surrounding stranding in the region of the iliotibial band insertion, indicating at least a sprain and possible partial-thickness tear at the insertion. 3. Moderate joint effusion with mildly increased density, likely a hemarthrosis. Electronically Signed   By: Bertina Broccoli M.D.   On: 12/29/2023 15:44   DG Knee 2 Views Right Result Date: 12/29/2023 CLINICAL DATA:  Right knee pain after fall yesterday. EXAM: RIGHT KNEE - 1-2 VIEW COMPARISON:  None Available. FINDINGS: No evidence of fracture, dislocation, or joint  effusion. No evidence of arthropathy or other focal bone abnormality. Soft tissues are unremarkable. IMPRESSION: Negative. Electronically Signed   By: Rosalene Colon M.D.   On: 12/29/2023 14:44    Scheduled Meds:  acetaminophen   1,000 mg Oral Q6H   amLODipine   10 mg Oral Daily   And   irbesartan   150 mg Oral Daily   docusate sodium   100 mg Oral BID   enoxaparin  (LOVENOX ) injection  40 mg Subcutaneous Q24H   magnesium  oxide  400 mg Oral Daily   mupirocin  ointment  1 Application Nasal BID   Continuous Infusions:   LOS: 1 day   Leona Rake, MD [facility] P4/25/2025, 11:44 AM

## 2023-12-31 NOTE — Progress Notes (Addendum)
   12/31/23 0849  TOC Brief Assessment  Insurance and Status Reviewed  Patient has primary care physician Yes  Home environment has been reviewed spouse  Prior level of function: prior to admission independent no DME  Prior/Current Home Services No current home services  Social Drivers of Health Review SDOH reviewed no interventions necessary  Readmission risk has been reviewed No   PT/OT to work with patient today.   Spoke to patient at bedside. Patient from home with husband no DME .  Patient provided Workers Comp Case Glass blower/designer.   Nikki Taylor [phone]. NCM called same and confirmed. Await recommendations and orders, once received will update Colin Dawley [phone] she she will arrange.  Patient will need HHPT/OT, wheelchair, rolling walker and bedside commode for discharge.   Entered orders, once signed will email to Colin Dawley at [email or web address] .Encarnacion Harris is NOT workers comp case manager     Colin Dawley aware of needs and anticipated discharge date of 01/01/24   Asked Colin Dawley for workers comp case manager's name and direct number awaiting response   Colin Dawley just spoke with [facility] they will have an adjuster assigned to patient within the hour and she will provide NCM with information   1340 Workers Comp information:   Programmer, multimedia  [phone] ext [phone]    [employer] [phone] [address], [place] , [place] [address]   Called Spoke to Allasyn Benjamin. Ms Amye Baller aware anticipated discharge date is 01/01/24 and patient needs HHPT/OT wheelchair, rolling walker and bedside commode. She will try to get everything arranged today , they are closed on weekends.   Orders and clinicals fax to [employer] at [phone] and emailed to [email or web address]   Patient did sign release of information   1520 Allasyn Benjamin instructed NCM to order DME through  [facility].  [employer] with [employer] and ordered walker, wheelchair and bedside commode. Forwarded Harriet Limber , [employer] email with her contact information on it for billing    NCM called Randel Buss with [facility], Amy with [facility], angela with [facility] , Artavia with [facility] , Lynette with [facility] , none will accept workers comp.   Cathy at [facility] does not have staffing   Calvin at [employer] cannot accept patient   Bartholomew Light at [facility] cannot accept referral  Kasie with [facility] cannot accept referral   [facility] cannot accept referral    Allasyn also working on home health   Allasyn unsure if she can arrange DME and [facility] today, they closed at 1630 and do not open until Monday. Updated team and patient and her husband   Mitch with [facility] unsure if he can provide DME, he is talking to his intake. Allasyn is calling other companies   NCM Asked  Jermaine with [employer] if [employer] would be willing to work out a Community education officer  for DME, unfortunately they cannot   Received a call from Allasyn : All DME is in stock and paid for. Patient's husband Gwynn Lesches will pick up DME tomorrow between 11 am and 2 pm at [facility], Allasyn spoke directly to [place] and confirmed he can. Lorrene Rosser will arrange [facility] Monday

## 2023-12-31 NOTE — Progress Notes (Signed)
 Orthopaedic Trauma Progress Note  SUBJECTIVE: Doing okay today.  Notes pain in the right lower extremity but this is controlled on current medications.  Notes she does have a high pain tolerance.  Her biggest complaint currently is some nausea and a headache.  She has received medications for both of these.  She was able to work with therapies this morning and mobilize to bedside chair.  Did well maintaining nonweightbearing on the right lower extremity.  No chest pain. No SOB. No nausea/vomiting. No other complaints.   OBJECTIVE:  Vitals:   12/31/23 0300 12/31/23 0820  BP: 117/68 120/74  Pulse: 96 97  Resp: 17 17  Temp: 97.8 F (36.6 C) 98.2 F (36.8 C)  SpO2: 97% 98%    Opiates Today (MME): Today's  total administered Morphine Milligram Equivalents: 20 Opiates Yesterday (MME): Yesterday's total administered Morphine Milligram Equivalents: 95  General: Sitting up in bedside chair, no acute distress Respiratory: No increased work of breathing.  Operative Extremity (RLE): Dressing clean, dry, intact.  Tenderness over the proximal tibia as expected.  No significant calf tenderness.  Ankle dorsiflexion/plantarflexion intact.  Able to wiggle the toes.  Endorses sensation light touch over all aspects of the foot.  Neurovascularly intact.  IMAGING: Stable post op imaging.   LABS:  Results for orders placed or performed during the hospital encounter of 12/29/23 (from the past 24 hours)  Basic metabolic panel with GFR     Status: Abnormal   Collection Time: 12/31/23  4:10 AM  Result Value Ref Range   Sodium 136 135 - 145 mmol/L   Potassium 4.1 3.5 - 5.1 mmol/L   Chloride 103 98 - 111 mmol/L   CO2 24 22 - 32 mmol/L   Glucose, Bld 129 (H) 70 - 99 mg/dL   BUN 10 8 - 23 mg/dL   Creatinine, Ser 9.52 0.44 - 1.00 mg/dL   Calcium 8.6 (L) 8.9 - 10.3 mg/dL   GFR, Estimated >84 >13 mL/min   Anion gap 9 5 - 15  CBC     Status: Abnormal   Collection Time: 12/31/23  4:10 AM  Result Value Ref  Range   WBC 11.6 (H) 4.0 - 10.5 K/uL   RBC 4.12 3.87 - 5.11 MIL/uL   Hemoglobin 12.6 12.0 - 15.0 g/dL   HCT 24.4 01.0 - 27.2 %   MCV 91.3 80.0 - 100.0 fL   MCH 30.6 26.0 - 34.0 pg   MCHC 33.5 30.0 - 36.0 g/dL   RDW 53.6 64.4 - 03.4 %   Platelets 237 150 - 400 K/uL   nRBC 0.0 0.0 - 0.2 %    ASSESSMENT: [name] is a 68 y.o. female, 1 Day Post-Op s/p fall at work Procedures: OPEN REDUCTION INTERNAL FIXATION RIGHT TIBIAL PLATEAU  CV/Blood loss: Hemoglobin 12.6 this morning, fairly stable from preop (13.0).  Hemodynamically stable  PLAN: Weightbearing: NWB RLE ROM: Okay for unrestricted ROM Incisional and dressing care: Reinforce dressings as needed  Showering: Okay to begin showering getting incision wet 01/03/24 Orthopedic device(s): None  Pain management: Continue current multimodal regimen VTE prophylaxis: Lovenox , SCDs ID: Vancomycin  post op Foley/Lines:  No foley, KVO IVFs Impediments to Fracture Healing: Vitamin D  level 53, no additional supplementation needed Dispo: PT/OT evaluation today, currently recommending home health therapies.  Okay for discharge from ortho standpoint once cleared by medicine team and therapies  D/C recommendations: - Tramadol , home dose Flexeril, Tylenol  as needed for pain control - Aspirin  81 mg daily x 30 days for  DVT prophylaxis - No additional need for Vit D supplementation  Follow - up plan: 2 weeks after d/c for wound check and repeat x-rays   Contact information:  Katheryne Pane MD, Alona Jamaica PA-C. After hours and holidays please check [email or web address] for group call information for [facility]   Edilia Gordon, PA-C [phone] (office) [email or web address]

## 2023-12-31 NOTE — Evaluation (Signed)
 Physical Therapy Evaluation Patient Details Name: [name] MRN: [id number] DOB: 21-Jun-1956 Today's Date: 12/31/2023  History of Present Illness  Pt is a 68 y.o. female presented to [facility] ED 4/23 for fall at work. CT showed R lateral tibial plateau fx. R ORIF performed 4/24. NWB  PMH: HTN, arthritis  Clinical Impression  Pt presents with RLE pain, headache-type pain, impaired balance s/p surgery given NWB status RLE, difficulty with mobility, and decreased activity tolerance. Pt to benefit from acute PT to address deficits. Pt ambulated short room distance with use of RW, initially requiring light steadying assist but transitioning to close guard for safety only. Pt anticipates good progress with mobility with continued pain control, pt has support of husband at home as well. PT to progress mobility as tolerated, and will continue to follow acutely.          If plan is discharge home, recommend the following: A little help with walking and/or transfers;A little help with bathing/dressing/bathroom;Help with stairs or ramp for entrance;Assist for transportation   Can travel by private vehicle        Equipment Recommendations Rolling walker (2 wheels);BSC/3in1;Wheelchair cushion (measurements PT);Wheelchair (measurements PT) (pt aware that insurance may not cover both w/c and RW)  Recommendations for Other Services       Functional Status Assessment Patient has had a recent decline in their functional status and demonstrates the ability to make significant improvements in function in a reasonable and predictable amount of time.     Precautions / Restrictions Precautions Precautions: Fall Recall of Precautions/Restrictions: Intact Restrictions Weight Bearing Restrictions Per Provider Order: Yes RLE Weight Bearing Per Provider Order: Non weight bearing      Mobility  Bed Mobility Overal bed mobility: Needs Assistance Bed Mobility: Supine to Sit, Sit to Supine      Supine to sit: Supervision, HOB elevated, Used rails     General bed mobility comments: increased time, pt using UEs to progress RLE to EOB towards L    Transfers Overall transfer level: Needs assistance Equipment used: Rolling walker (2 wheels) Transfers: Sit to/from Stand, Bed to chair/wheelchair/BSC Sit to Stand: Min assist Stand pivot transfers: Min assist         General transfer comment: assist for power up, rise, steady, and hop-pivot on LLE to BSC towards pt's R. STand x2, transitioning to min guard for safety    Ambulation/Gait Ambulation/Gait assistance: Contact guard assist, Min assist Gait Distance (Feet): 8 Feet Assistive device: Rolling walker (2 wheels) Gait Pattern/deviations: Step-to pattern, Trunk flexed Gait velocity: decr     General Gait Details: hop-to gait, initially requiring steadying assist transitioning to close guard for safety. good maintenance of NWB RLE  Stairs            Wheelchair Mobility     Tilt Bed    Modified Rankin (Stroke Patients Only)       Balance Overall balance assessment: Needs assistance Sitting-balance support: No upper extremity supported, Feet supported Sitting balance-Leahy Scale: Fair     Standing balance support: Bilateral upper extremity supported, During functional activity Standing balance-Leahy Scale: Poor Standing balance comment: reliant on external support                             Pertinent Vitals/Pain Pain Assessment Pain Assessment: 0-10 Pain Score: 6  Pain Location: R ankle Pain Descriptors / Indicators: Sore, Discomfort Pain Intervention(s): Limited activity within patient's tolerance, Monitored during session, Repositioned  Home Living Family/patient expects to be discharged to:: Private residence Living Arrangements: Spouse/significant other Available Help at Discharge: Family Type of Home: House Home Access: Ramped entrance       Home Layout: One level Home  Equipment: None      Prior Function Prior Level of Function : Independent/Modified Independent;Working/employed;Driving             Mobility Comments: 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 [employer] Extremity Assessment: Defer to OT evaluation    Lower Extremity Assessment Lower Extremity Assessment: Overall WFL for tasks assessed;RLE deficits/detail RLE Deficits / Details: post-operative guarding and pain; able to perform toe flexion/extension, DF/PF, knee flexion to 90 deg in sitting, knee extension lacking 10 deg in sitting RLE: Unable to fully assess due to pain    Cervical / Trunk Assessment Cervical / Trunk Assessment: Normal  Communication   Communication Communication: No apparent difficulties    Cognition Arousal: Alert Behavior During Therapy: WFL for tasks assessed/performed   PT - Cognitive impairments: No apparent impairments                                 Cueing       General Comments      Exercises     Assessment/Plan    PT Assessment Patient needs continued PT services  PT Problem List Decreased strength;Decreased mobility;Decreased activity tolerance;Decreased balance;Decreased knowledge of use of DME;Pain;Decreased safety awareness;Decreased range of motion;Decreased knowledge of precautions       PT Treatment Interventions DME instruction;Therapeutic activities;Gait training;Therapeutic exercise;Patient/family education;Balance training;Stair training;Functional mobility training;Neuromuscular re-education    PT Goals (Current goals can be found in the Care Plan section)  Acute Rehab PT Goals Patient Stated Goal: home PT Goal Formulation: With patient Time For Goal Achievement: 01/14/24 Potential to Achieve Goals: Good    Frequency Min 3X/week     Co-evaluation               AM-PAC PT "6 Clicks" Mobility  Outcome Measure Help needed turning from your back  to your side while in a flat bed without using bedrails?: A Little Help needed moving from lying on your back to sitting on the side of a flat bed without using bedrails?: A Little Help needed moving to and from a bed to a chair (including a wheelchair)?: A Little Help needed standing up from a chair using your arms (e.g., wheelchair or bedside chair)?: A Little Help needed to walk in hospital room?: A Little Help needed climbing 3-5 steps with a railing? : A Lot 6 Click Score: 17    End of Session   Activity Tolerance: Patient tolerated treatment well Patient left: in chair;with call bell/phone within reach;with chair alarm set;with nursing/sitter in room Nurse Communication: Mobility status;Patient requests pain meds PT Visit Diagnosis: Other abnormalities of gait and mobility (R26.89);Muscle weakness (generalized) (M62.81)    Time: 7829-5621 PT Time Calculation (min) (ACUTE ONLY): 38 min   Charges:   PT Evaluation $PT Eval Low Complexity: 1 Low PT Treatments $Therapeutic Activity: 8-22 mins PT General Charges $$ ACUTE PT VISIT: 1 Visit         [name], PT DPT Acute Rehabilitation Services Secure Chat Preferred  Office [phone]   Vincy Feliz E Burnadette Carrion 12/31/2023, 10:29 AM

## 2023-12-31 NOTE — TOC CAGE-AID Note (Signed)
 Transition of Care [facility]) - CAGE-AID Screening   Patient Details  Name: Treesa Mccully MRN: [id number] Date of Birth: Dec 24, 1955  [facility] of [facility] CM/SW Contact:    Mart Colpitts E Sari Cogan, LCSW Phone Number: 12/31/2023, 11:39 AM   Clinical Narrative: Met with patient at bedside. Patient denies substance use.   CAGE-AID Screening:    Have You Ever Felt You Ought to Cut Down on Your Drinking or Drug Use?: No Have People Annoyed You By Critizing Your Drinking Or Drug Use?: No Have You Felt Bad Or Guilty About Your Drinking Or Drug Use?: No Have You Ever Had a Drink or Used Drugs First Thing In The Morning to Steady Your Nerves or to Get Rid of a Hangover?: No CAGE-AID Score: 0  Substance Abuse Education Offered: No

## 2023-12-31 NOTE — Discharge Instructions (Signed)
 Orthopaedic Trauma Service Discharge Instructions   General Discharge Instructions  WEIGHT BEARING STATUS: Nonweightbearing right lower extremity  RANGE OF MOTION/ACTIVITY: Okay for unrestricted knee range of motion  Wound Care: You may remove your surgical dressing on postoperative day #3 (01/02/2024). Incisions can be left open to air if there is no drainage. Once the incision is completely dry and without drainage, it may be left open to air out.  Showering may begin postoperative day #4 (01/03/2024).  Clean incision gently with soap and water.  DVT/PE prophylaxis: Aspirin  81 mg daily x 30 days  Diet: as you were eating previously.  Can use over the counter stool softeners and bowel preparations, such as Miralax , to help with bowel movements.  Narcotics can be constipating.  Be sure to drink plenty of fluids  PAIN MEDICATION USE AND EXPECTATIONS  You have likely been given narcotic medications to help control your pain.  After a traumatic event that results in an fracture (broken bone) with or without surgery, it is ok to use narcotic pain medications to help control one's pain.  We understand that everyone responds to pain differently and each individual patient will be evaluated on a regular basis for the continued need for narcotic medications. Ideally, narcotic medication use should last no more than 6-8 weeks (coinciding with fracture healing).   As a patient it is your responsibility as well to monitor narcotic medication use and report the amount and frequency you use these medications when you come to your office visit.   We would also advise that if you are using narcotic medications, you should take a dose prior to therapy to maximize you participation.  IF YOU ARE ON NARCOTIC MEDICATIONS IT IS NOT PERMISSIBLE TO OPERATE A MOTOR VEHICLE (MOTORCYCLE/CAR/TRUCK/MOPED) OR HEAVY MACHINERY DO NOT MIX NARCOTICS WITH OTHER CNS (CENTRAL NERVOUS SYSTEM) DEPRESSANTS SUCH AS  ALCOHOL  POST-OPERATIVE OPIOID TAPER INSTRUCTIONS: It is important to wean off of your opioid medication as soon as possible. If you do not need pain medication after your surgery it is ok to stop day one. Opioids include: Codeine, Hydrocodone (Norco, Vicodin), Oxycodone (Percocet, oxycontin ) and hydromorphone  amongst others.  Long term and even short term use of opiods can cause: Increased pain response Dependence Constipation Depression Respiratory depression And more.  Withdrawal symptoms can include Flu like symptoms Nausea, vomiting And more Techniques to manage these symptoms Hydrate well Eat regular healthy meals Stay active Use relaxation techniques(deep breathing, meditating, yoga) Do Not substitute Alcohol to help with tapering If you have been on opioids for less than two weeks and do not have pain than it is ok to stop all together.  Plan to wean off of opioids This plan should start within one week post op of your fracture surgery  Maintain the same interval or time between taking each dose and first decrease the dose.  Cut the total daily intake of opioids by one tablet each day Next start to increase the time between doses. The last dose that should be eliminated is the evening dose.    STOP SMOKING OR USING NICOTINE PRODUCTS!!!!  As discussed nicotine severely impairs your body's ability to heal surgical and traumatic wounds but also impairs bone healing.  Wounds and bone heal by forming microscopic blood vessels (angiogenesis) and nicotine is a vasoconstrictor (essentially, shrinks blood vessels).  Therefore, if vasoconstriction occurs to these microscopic blood vessels they essentially disappear and are unable to deliver necessary nutrients to the healing tissue.  This is one modifiable factor  that you can do to dramatically increase your chances of healing your injury.  (This means no smoking, no nicotine gum, patches, etc)  DO NOT USE NONSTEROIDAL  ANTI-INFLAMMATORY DRUGS (NSAID'S)  Using products such as Advil  (ibuprofen ), Aleve (naproxen), Motrin  (ibuprofen ) for additional pain control during fracture healing can delay and/or prevent the healing response.  If you would like to take over the counter (OTC) medication, Tylenol  (acetaminophen ) is ok.  However, some narcotic medications that are given for pain control contain acetaminophen  as well. Therefore, you should not exceed more than 4000 mg of tylenol  in a day if you do not have liver disease.  Also note that there are may OTC medicines, such as cold medicines and allergy medicines that my contain tylenol  as well.  If you have any questions about medications and/or interactions please ask your doctor/PA or your pharmacist.      ICE AND ELEVATE INJURED/OPERATIVE EXTREMITY  Using ice and elevating the injured extremity above your heart can help with swelling and pain control.  Icing in a pulsatile fashion, such as 20 minutes on and 20 minutes off, can be followed.    Do not place ice directly on skin. Make sure there is a barrier between to skin and the ice pack.    Using frozen items such as frozen peas works well as the conform nicely to the are that needs to be iced.  USE AN ACE WRAP OR TED HOSE FOR SWELLING CONTROL  In addition to icing and elevation, Ace wraps or TED hose are used to help limit and resolve swelling.  It is recommended to use Ace wraps or TED hose until you are informed to stop.    When using Ace Wraps start the wrapping distally (farthest away from the body) and wrap proximally (closer to the body)   Example: If you had surgery on your leg or thing and you do not have a splint on, start the ace wrap at the toes and work your way up to the thigh        If you had surgery on your upper extremity and do not have a splint on, start the ace wrap at your fingers and work your way up to the upper arm   CALL THE OFFICE FOR MEDICATION REFILLS OR WITH ANY QUESTIONS/CONCERNS:  [phone]   VISIT OUR WEBSITE FOR ADDITIONAL INFORMATION: [email or web address]   Discharge Wound Care Instructions  Do NOT apply any ointments, solutions or lotions to pin sites or surgical wounds.  These prevent needed drainage and even though solutions like hydrogen peroxide kill bacteria, they also damage cells lining the pin sites that help fight infection.  Applying lotions or ointments can keep the wounds moist and can cause them to breakdown and open up as well. This can increase the risk for infection. When in doubt call the office.  Surgical incisions should be dressed daily.  If any drainage is noted, use one layer of adaptic or Mepitel, then gauze, Kerlix, and an ace wrap. - These dressing supplies should be available at local medical supply stores ([facility], [facility], etc) as well as Insurance claims handler ([facility], [facility], [employer], etc)  Once the incision is completely dry and without drainage, it may be left open to air out.  Showering may begin 36-48 hours later.  Cleaning gently with soap and water.  Traumatic wounds should be dressed daily as well.    One layer of adaptic, gauze, Kerlix, then ace wrap.  The adaptic can be  discontinued once the draining has ceased    If you have a wet to dry dressing: wet the gauze with saline the squeeze as much saline out so the gauze is moist (not soaking wet), place moistened gauze over wound, then place a dry gauze over the moist one, followed by Kerlix wrap, then ace wrap.    Call office for the following: Temperature greater than 101F Persistent nausea and vomiting Severe uncontrolled pain Redness, tenderness, or signs of infection (pain, swelling, redness, odor or green/yellow discharge around the site) Difficulty breathing, headache or visual disturbances Hives Persistent dizziness or light-headedness Extreme fatigue Any other questions or concerns you may have after discharge  In an emergency, call 911 or go to an  Emergency Department at a nearby hospital  OTHER HELPFUL INFORMATION  If you had a block, it will wear off between 8-24 hrs postop typically.  This is period when your pain may go from nearly zero to the pain you would have had postop without the block.  This is an abrupt transition but nothing dangerous is happening.  You may take an extra dose of narcotic when this happens.  You should wean off your narcotic medicines as soon as you are able.  Most patients will be off or using minimal narcotics before their first postop appointment.   We suggest you use the pain medication the first night prior to going to bed, in order to ease any pain when the anesthesia wears off. You should avoid taking pain medications on an empty stomach as it will make you nauseous.  Do not drink alcoholic beverages or take illicit drugs when taking pain medications.  In most states it is against the law to drive while you are in a splint or sling.  And certainly against the law to drive while taking narcotics.  You may return to work/school in the next couple of days when you feel up to it.   Pain medication may make you constipated.  Below are a few solutions to try in this order: Decrease the amount of pain medication if you aren't having pain. Drink lots of decaffeinated fluids. Drink prune juice and/or each dried prunes  If the first 3 don't work start with additional solutions Take Colace - an over-the-counter stool softener Take Senokot - an over-the-counter laxative Take Miralax  - a stronger over-the-counter laxative

## 2023-12-31 NOTE — Progress Notes (Addendum)
 OT Cancellation Note  Patient Details Name: [name] MRN: [id number] DOB: 11/17/1955   Cancelled Treatment:    Reason Eval/Treat Not Completed: Fatigue/lethargy limiting ability to participate. Pt in bed reporting lightheadedness, nausea, and vomiting. RN notified OT will return as able to.  Jazzlyn Huizenga C, OT  Acute Rehabilitation Services Office [phone] Secure chat preferred   Mickael Alamo 12/31/2023, 2:39 PM

## 2023-12-31 NOTE — Plan of Care (Signed)

## 2023-12-31 NOTE — TOC CM/SW Note (Signed)
    Durable Medical Equipment  (From admission, onward)           Start     Ordered   12/31/23 1125  For home use only DME Walker rolling  Once       Question Answer Comment  Walker: With 5 Inch Wheels   Patient needs a walker to treat with the following condition Weakness      12/31/23 1126   12/31/23 1125  For home use only DME 3 n 1  Once        12/31/23 1126   12/31/23 1125  For home use only DME standard manual wheelchair with seat cushion  Once       Comments: Patient suffers from S/P Open reduction internal fixation of right lateral tibial plateau fracture, which impairs their ability to perform daily activities like ambulating  in the home.  A cane  will not resolve issue with performing activities of daily living. A wheelchair will allow patient to safely perform daily activities. Patient can safely propel the wheelchair in the home or has a caregiver who can provide assistance. Length of need Lifetime. Accessories: elevating leg rests (ELRs), wheel locks, extensions and anti-tippers.  Seat and back cushions   12/31/23 1126

## 2024-01-01 DIAGNOSIS — S89091D Other physeal fracture of upper end of right tibia, subsequent encounter for fracture with routine healing: Secondary | ICD-10-CM | POA: Diagnosis not present

## 2024-01-01 LAB — CBC
HCT: 36.2 % (ref 36.0–46.0)
Hemoglobin: 12 g/dL (ref 12.0–15.0)
MCH: 30.4 pg (ref 26.0–34.0)
MCHC: 33.1 g/dL (ref 30.0–36.0)
MCV: 91.6 fL (ref 80.0–100.0)
Platelets: 233 10*3/uL (ref 150–400)
RBC: 3.95 MIL/uL (ref 3.87–5.11)
RDW: 13.2 % (ref 11.5–15.5)
WBC: 7.3 10*3/uL (ref 4.0–10.5)
nRBC: 0 % (ref 0.0–0.2)

## 2024-01-01 MED ORDER — DIPHENHYDRAMINE HCL 50 MG/ML IJ SOLN
25.0000 mg | Freq: Once | INTRAMUSCULAR | Status: AC
  Administered 2024-01-01: 25 mg via INTRAVENOUS
  Filled 2024-01-01: qty 1

## 2024-01-01 MED ORDER — KETOROLAC TROMETHAMINE 15 MG/ML IJ SOLN
15.0000 mg | Freq: Once | INTRAMUSCULAR | Status: AC
  Administered 2024-01-01: 15 mg via INTRAVENOUS
  Filled 2024-01-01: qty 1

## 2024-01-01 MED ORDER — DICLOFENAC SODIUM 75 MG PO TBEC: 75.0000 mg | DELAYED_RELEASE_TABLET | Freq: Two times a day (BID) | ORAL | Status: AC | PRN

## 2024-01-01 MED ORDER — PROCHLORPERAZINE EDISYLATE 10 MG/2ML IJ SOLN
10.0000 mg | Freq: Once | INTRAMUSCULAR | Status: AC
  Administered 2024-01-01: 10 mg via INTRAVENOUS
  Filled 2024-01-01: qty 2

## 2024-01-01 NOTE — Discharge Summary (Signed)
 Physician Discharge Summary  [name] RUE:[id number] DOB: 1956-06-03 DOA: 12/29/2023  PCP: Jonathon Neighbors, MD  Admit date: 12/29/2023 Discharge date: 01/01/2024  Admitted From: Home Disposition:  Home  Discharge Condition:Stable CODE STATUS:FULL Diet recommendation: Heart Healthy  Brief/Interim Summary: Patient is a 68 year old female with history of anemia, arthritis, chronic pain syndrome, hypertension who presented to the emergency department after mechanical fall at work complaining of right knee pain.   she was hemodynamically stable on presentation.  CT hip showed right lateral tibial plateau fracture.Orthopedics consulted.  Status post ORIF of right lateral tibial plateau fracture on 4/24.  PT/OT recommending home health.  Medically stable for discharge home today.  Following problems were addressed during the hospitalization:  Right lateral tibia plateau fracture:  S/P ORIF.  PT/OT recommending home health on discharge.  Started on aspirin for DVT prophylaxis  Hypertension: Currently blood pressure stable.  Continue current medications   History of prolonged QT:  Avoid QT prolonging medications.  EKG stable.    Hypokalemia: Supplemented with potassium and corrected  Headache: Given a dose of migraine cocktail today.   Discharge Diagnoses:  Principal Problem:   Tibial fracture Active Problems:   Closed fracture of lateral portion of right tibial plateau    Discharge Instructions  Discharge Instructions     Diet - low sodium heart healthy   Complete by: As directed    Discharge instructions   Complete by: As directed    1)Please take your medications as instructed 2)Follow up with orthopedics as outpatient 3)Follow up with home health   Increase activity slowly   Complete by: As directed       Allergies as of 01/01/2024       Reactions   Cephalexin Nausea And Vomiting, Nausea Only, Other (See Comments)   Headaches Patient said she CAN tolerate PCN &  Amoxicillin   Fentanyl  Nausea And Vomiting, Other (See Comments)   Headaches, too   Hydrocodone  Nausea And Vomiting, Other (See Comments)   Headaches, also   Dairy Aid [tilactase] Rash   Gluten Meal Other (See Comments)   Headaches   Latex Itching, Rash   Meperidine Nausea And Vomiting   Milk Protein Dermatitis, Rash   Milk-related Compounds Dermatitis, Rash   Morphine And Codeine Nausea And Vomiting, Nausea Only, Other (See Comments)   Headaches, too   Percocet [oxycodone -acetaminophen ] Nausea And Vomiting        Medication List     STOP taking these medications    methocarbamol  500 MG tablet Commonly known as: ROBAXIN        TAKE these medications    amLODipine -valsartan  10-160 MG tablet Commonly known as: EXFORGE  Take 1 tablet by mouth daily.   aspirin EC 81 MG tablet Take 1 tablet (81 mg total) by mouth daily. Swallow whole.   cyclobenzaprine 5 MG tablet Commonly known as: FLEXERIL Take 5 mg by mouth See admin instructions. Take 5 mg by mouth in the morning and at bedtime when working and an additional 5 mg once a day when off (work)   diclofenac 75 MG EC tablet Commonly known as: VOLTAREN Take 1 tablet (75 mg total) by mouth 2 (two) times daily as needed. What changed:  when to take this reasons to take this   hydrocortisone 2.5 % cream Apply 1 Application topically See admin instructions. APPLY CREAM TO AFFECTED AREA TWICE DAILY AS DIRECTED   magnesium  oxide 400 MG tablet Commonly known as: MAG-OX Take 400 mg by mouth at bedtime.   NON FORMULARY  Take 30 mLs by mouth See admin instructions. Blackstrap Molasses : Mix 30 ml's into a sufficient amount of water and drink by mouth at bedtime as needed for leg cramps   ondansetron  4 MG disintegrating tablet Commonly known as: Zofran  ODT Take 1 tablet (4 mg total) by mouth every 8 (eight) hours as needed.   Potassium 99 MG Tabs Take 99 mg by mouth at bedtime as needed (leg cramps).   traMADol  50 MG  tablet Commonly known as: ULTRAM  Take 1-2 tablets (50-100 mg total) by mouth every 6 (six) hours as needed for moderate pain (pain score 4-6) or severe pain (pain score 7-10). What changed:  how much to take reasons to take this               Durable Medical Equipment  (From admission, onward)           Start     Ordered   12/31/23 1125  For home use only DME Walker rolling  Once       Question Answer Comment  Walker: With 5 Inch Wheels   Patient needs a walker to treat with the following condition Weakness      12/31/23 1126   12/31/23 1125  For home use only DME 3 n 1  Once        12/31/23 1126   12/31/23 1125  For home use only DME standard manual wheelchair with seat cushion  Once       Comments: Patient suffers from S/P Open reduction internal fixation of right lateral tibial plateau fracture, which impairs their ability to perform daily activities like ambulating  in the home.  A cane  will not resolve issue with performing activities of daily living. A wheelchair will allow patient to safely perform daily activities. Patient can safely propel the wheelchair in the home or has a caregiver who can provide assistance. Length of need Lifetime. Accessories: elevating leg rests (ELRs), wheel locks, extensions and anti-tippers.  Seat and back cushions   12/31/23 1126            Follow-up Information     Haddix, Florentina Huntsman, MD. Schedule an appointment as soon as possible for a visit in 2 week(s).   Specialty: Orthopedic Surgery Why: for wound check and repeat x-rays Contact information: [address] [place] [address] [phone]         Jonathon Neighbors, MD. Schedule an appointment as soon as possible for a visit in 1 week(s).   Specialty: Family Medicine Contact information: [address] [place] [address] [phone]                Allergies  Allergen Reactions   Cephalexin Nausea And Vomiting, Nausea Only and Other (See Comments)     Headaches  Patient said she CAN tolerate PCN & Amoxicillin   Fentanyl  Nausea And Vomiting and Other (See Comments)    Headaches, too   Hydrocodone  Nausea And Vomiting and Other (See Comments)    Headaches, also   Dairy Aid [Tilactase] Rash   Gluten Meal Other (See Comments)    Headaches    Latex Itching and Rash   Meperidine Nausea And Vomiting   Milk Protein Dermatitis and Rash   Milk-Related Compounds Dermatitis and Rash   Morphine And Codeine Nausea And Vomiting, Nausea Only and Other (See Comments)    Headaches, too   Percocet [Oxycodone -Acetaminophen ] Nausea And Vomiting    Consultations: Orthopedics   Procedures/Studies: DG Knee  Right Port Result Date: 12/30/2023 CLINICAL DATA:  Fracture EXAM: PORTABLE RIGHT KNEE - 1-2 VIEW COMPARISON:  Right knee x-ray 12/30/2023 and 12/29/2023. FINDINGS: Lateral sideplate and screws are seen in the proximal tibia is fracture. Alignment is anatomic. No new fractures are seen. Soft tissue swelling and air is present compatible with recent surgery. Peripheral vascular calcifications are present. IMPRESSION: Status post ORIF of proximal tibia fracture. Electronically Signed   By: Tyron Gallon M.D.   On: 12/30/2023 16:55   DG Knee 1-2 Views Right Result Date: 12/30/2023 CLINICAL DATA:  Right tibial plateau ORIF EXAM: RIGHT KNEE - 1-2 VIEW COMPARISON:  Right knee radiographs 12/29/2023, CT right knee 12/29/2023 FINDINGS: Images were performed intraoperatively without the presence of a radiologist. The patient is undergoing lateral plate and screw fixation of the previously seen depressed lateral tibial plateau Schatzker III fracture. No hardware complication is seen. Total fluoroscopy images: 4 Total fluoroscopy time: 31 seconds Total dose: Radiation Exposure Index (as provided by the fluoroscopic device): 1.98 mGy air Kerma Please see intraoperative findings for further detail. IMPRESSION: Intraoperative fluoroscopy for lateral tibial plateau  ORIF. Electronically Signed   By: Bertina Broccoli M.D.   On: 12/30/2023 13:10   DG C-Arm 1-60 Min-No Report Result Date: 12/30/2023 Fluoroscopy was utilized by the requesting physician.  No radiographic interpretation.   CT Knee Right Wo Contrast Result Date: 12/29/2023 CLINICAL DATA:  Knee trauma. Tibial plateau fracture. Mechanical fall yesterday at work. EXAM: CT OF THE RIGHT KNEE WITHOUT CONTRAST TECHNIQUE: Multidetector CT imaging of the right knee was performed according to the standard protocol. Multiplanar CT image reconstructions were also generated. RADIATION DOSE REDUCTION: This exam was performed according to the departmental dose-optimization program which includes automated exposure control, adjustment of the mA and/or kV according to patient size and/or use of iterative reconstruction technique. COMPARISON:  Right knee radiographs 12/29/2023 FINDINGS: Bones/Joint/Cartilage Trauma there is there is depression of the lateral tibial plateau in a region measuring up to approximately 20 mm in transverse dimension and 18 mm in AP dimension (coronal series 6, image 41 and sagittal series 7, image 35) with up to approximately 7 mm cortical depression, greatest laterally. This is consistent with a Schatzker III lateral depression fracture. No medial tibial plateau fracture is seen. Minimal superior patellar degenerative spurring. The patellar apex is approximately 5 mm lateral to the trochlear notch, with overall mild lateralization of the patella. There is mild angulation with a short horizontal "shelf" measuring up to 7 mm in AP dimension at the posterior aspect of the proximal tibial epiphysis/physis region of the medial tibial plateau (sagittal series 7, image 53) without an acute fracture line identified. This likely is chronic. Ligaments Suboptimally assessed by CT. There is a 5 mm chronic oval ossicle overlying the proximal anterior aspect of the medial collateral ligament, likely the sequela of  remote trauma. Ligament is visualized and no full-thickness tear is seen. Muscles and Tendons The mild tibial plateau fracture extends into the anterolateral aspect of the proximal tibia including Gerdy's tubercle at the iliotibial band tendon insertion. There is mild thickening and surrounding stranding in the region of the iliotibial band insertion (axial images 55 through 67 in this region, indicating at least a sprain and possible partial-thickness tear at the insertion. Soft tissues Moderate joint effusion with mildly increased density measuring up to 26 Hounsfield units, likely a hemarthrosis. There is mild subcutaneous fat edema and swelling of the lateral and anterior knee. Dense atherosclerotic calcifications. IMPRESSION: 1. Schatzker III lateral  depression fracture of the lateral tibial plateau with up to approximately 7 mm cortical depression, greatest laterally. 2. The lateral tibial plateau fracture extends into the anterolateral aspect of the proximal tibia including Gerdy's tubercle at the iliotibial band tendon insertion. There is mild thickening and surrounding stranding in the region of the iliotibial band insertion, indicating at least a sprain and possible partial-thickness tear at the insertion. 3. Moderate joint effusion with mildly increased density, likely a hemarthrosis. Electronically Signed   By: Bertina Broccoli M.D.   On: 12/29/2023 15:44   DG Knee 2 Views Right Result Date: 12/29/2023 CLINICAL DATA:  Right knee pain after fall yesterday. EXAM: RIGHT KNEE - 1-2 VIEW COMPARISON:  None Available. FINDINGS: No evidence of fracture, dislocation, or joint effusion. No evidence of arthropathy or other focal bone abnormality. Soft tissues are unremarkable. IMPRESSION: Negative. Electronically Signed   By: Rosalene Colon M.D.   On: 12/29/2023 14:44      Subjective: Patient seen and examined at bedside today.  Comfortable sitting on the chair.No significant pain on the right lower  extremity.  Complains of some headache today.  Medically stable for discharge  Discharge Exam: Vitals:   01/01/24 0057 01/01/24 0759  BP: 112/68 136/72  Pulse: 91 (!) 108  Resp: 18 18  Temp: 98.3 F (36.8 C) 98.8 F (37.1 C)  SpO2: 90% 98%   Vitals:   12/31/23 1712 12/31/23 2159 01/01/24 0057 01/01/24 0759  BP: 124/75 135/74 112/68 136/72  Pulse: 94 (!) 109 91 (!) 108  Resp: 18 18 18 18   Temp: 97.9 F (36.6 C) 98.2 F (36.8 C) 98.3 F (36.8 C) 98.8 F (37.1 C)  TempSrc: Oral Oral Oral Oral  SpO2: 99% 99% 90% 98%  Weight:      Height:        General: Pt is alert, awake, not in acute distress Cardiovascular: RRR, S1/S2 +, no rubs, no gallops Respiratory: CTA bilaterally, no wheezing, no rhonchi Abdominal: Soft, NT, ND, bowel sounds + Extremities: no edema, no cyanosis, right lower extremity wrapped with dressing    The results of significant diagnostics from this hospitalization (including imaging, microbiology, ancillary and laboratory) are listed below for reference.     Microbiology: Recent Results (from the past 240 hours)  Surgical PCR screen     Status: None   Collection Time: 12/30/23  3:01 AM   Specimen: Nasal Mucosa; Nasal Swab  Result Value Ref Range Status   MRSA, PCR NEGATIVE NEGATIVE Final   Staphylococcus aureus NEGATIVE NEGATIVE Final    Comment: (NOTE) The Xpert SA Assay (FDA approved for NASAL specimens in patients 103 years of age and older), is one component of a comprehensive surveillance program. It is not intended to diagnose infection nor to guide or monitor treatment. Performed at [facility], [address] [place], [place] [address]      Labs: BNP (last 3 results) No results for input(s): "BNP" in the last 8760 hours. Basic Metabolic Panel: Recent Labs  Lab 12/29/23 1713 12/30/23 0619 12/30/23 0621 12/31/23 0410  NA 142 139 140 136  K 3.7 3.4* 3.4* 4.1  CL 104 103 104 103  CO2 26 25 25 24   GLUCOSE 87 94 95 129*   BUN 10 11 11 10   CREATININE 0.54 0.54 0.48 0.54  CALCIUM 9.7 8.9 9.0 8.6*  MG  --  2.2  --   --   PHOS  --  4.0  --   --  Liver Function Tests: Recent Labs  Lab 12/30/23 0619 12/30/23 0621  AST 27 26  ALT 30 30  ALKPHOS 70 70  BILITOT 0.6 0.7  PROT 6.7 6.8  ALBUMIN 3.6 3.5   No results for input(s): "LIPASE", "AMYLASE" in the last 168 hours. No results for input(s): "AMMONIA" in the last 168 hours. CBC: Recent Labs  Lab 12/29/23 1713 12/30/23 0619 12/31/23 0410 01/01/24 0501  WBC 8.0 7.6 11.6* 7.3  NEUTROABS 4.8 4.6  --   --   HGB 13.7 13.0 12.6 12.0  HCT 41.3 40.0 37.6 36.2  MCV 91.0 93.0 91.3 91.6  PLT 235 237 237 233   Cardiac Enzymes: No results for input(s): "CKTOTAL", "CKMB", "CKMBINDEX", "TROPONINI" in the last 168 hours. BNP: Invalid input(s): "POCBNP" CBG: No results for input(s): "GLUCAP" in the last 168 hours. D-Dimer No results for input(s): "DDIMER" in the last 72 hours. Hgb A1c Recent Labs    12/30/23 0619  HGBA1C 5.4   Lipid Profile No results for input(s): "CHOL", "HDL", "LDLCALC", "TRIG", "CHOLHDL", "LDLDIRECT" in the last 72 hours. Thyroid function studies No results for input(s): "TSH", "T4TOTAL", "T3FREE", "THYROIDAB" in the last 72 hours.  Invalid input(s): "FREET3" Anemia work up No results for input(s): "VITAMINB12", "FOLATE", "FERRITIN", "TIBC", "IRON", "RETICCTPCT" in the last 72 hours. Urinalysis    Component Value Date/Time   COLORURINE YELLOW 02/11/2016 0837   APPEARANCEUR CLEAR 02/11/2016 0837   LABSPEC 1.015 02/11/2016 0837   PHURINE 8.0 02/11/2016 0837   GLUCOSEU NEGATIVE 02/11/2016 0837   HGBUR NEGATIVE 02/11/2016 0837   BILIRUBINUR NEGATIVE 02/11/2016 0837   KETONESUR NEGATIVE 02/11/2016 0837   PROTEINUR NEGATIVE 02/11/2016 0837   NITRITE NEGATIVE 02/11/2016 0837   LEUKOCYTESUR NEGATIVE 02/11/2016 0837   Sepsis Labs Recent Labs  Lab 12/29/23 1713 12/30/23 0619 12/31/23 0410 01/01/24 0501  WBC 8.0 7.6  11.6* 7.3   Microbiology Recent Results (from the past 240 hours)  Surgical PCR screen     Status: None   Collection Time: 12/30/23  3:01 AM   Specimen: Nasal Mucosa; Nasal Swab  Result Value Ref Range Status   MRSA, PCR NEGATIVE NEGATIVE Final   Staphylococcus aureus NEGATIVE NEGATIVE Final    Comment: (NOTE) The Xpert SA Assay (FDA approved for NASAL specimens in patients 19 years of age and older), is one component of a comprehensive surveillance program. It is not intended to diagnose infection nor to guide or monitor treatment. Performed at [facility], [address] [place], [place] [address]     Please note: You were cared for by a hospitalist during your hospital stay. Once you are discharged, your primary care physician will handle any further medical issues. Please note that NO REFILLS for any discharge medications will be authorized once you are discharged, as it is imperative that you return to your primary care physician (or establish a relationship with a primary care physician if you do not have one) for your post hospital discharge needs so that they can reassess your need for medications and monitor your lab values.    Time coordinating discharge: 40 minutes  SIGNED:   Leona Rake, MD  [facility] 01/01/2024, 11:12 AM Pager [phone]  If 7PM-7AM, please contact night-coverage [email or web address] Password [email or web address]

## 2024-01-01 NOTE — Progress Notes (Signed)
 Physical Therapy Treatment Patient Details Name: [name] MRN: [id number] DOB: 26-Oct-1955 Today's Date: 01/01/2024   History of Present Illness Pt is a 68 y.o. female presented to [facility] ED 4/23 for fall at work. CT showed R lateral tibial plateau fx. R ORIF performed 4/24. NWB  PMH: HTN, arthritis    PT Comments  Pt admitted with above diagnosis. Pt very lethargic upon entry to room.  Pt states she got Benadryl  and hasn't been able to stay awake since. Asked PT to assist her to bed.  Pt was able to stand and pivot and get back to bed with CGA with RW for transfers and maintains NWB right LE.  Issued gait belt to pt.  Pt with good safety awareness.  Continue PT.   Pt currently with functional limitations due to the deficits listed below (see PT Problem List). Pt will benefit from acute skilled PT to increase their independence and safety with mobility to allow discharge.       If plan is discharge home, recommend the following: A little help with walking and/or transfers;A little help with bathing/dressing/bathroom;Help with stairs or ramp for entrance;Assist for transportation   Can travel by private vehicle        Equipment Recommendations  Rolling walker (2 wheels);BSC/3in1;Wheelchair cushion (measurements PT);Wheelchair (measurements PT) (pt aware that insurance may not cover both w/c and RW; issued gait belt to pt)    Recommendations for Other Services       Precautions / Restrictions Precautions Precautions: Fall Recall of Precautions/Restrictions: Intact Restrictions Weight Bearing Restrictions Per Provider Order: Yes RLE Weight Bearing Per Provider Order: Non weight bearing     Mobility  Bed Mobility Overal bed mobility: Modified Independent Bed Mobility: Supine to Sit     Supine to sit: Modified independent (Device/Increase time)     General bed mobility comments: increase in time but simulated without bed rails and flat    Transfers Overall transfer  level: Needs assistance Equipment used: Rolling walker (2 wheels) Transfers: Sit to/from Stand Sit to Stand: Contact guard assist Stand pivot transfers: Contact guard assist         General transfer comment: No assist for power up, rise, or steady with pt able to  hop-pivot on LLE to bed.  Pt took Benadryl  and was very sleepy. No LOB and good safety overal.    Ambulation/Gait Ambulation/Gait assistance: Contact guard assist Gait Distance (Feet): 4 Feet Assistive device: Rolling walker (2 wheels) Gait Pattern/deviations: Step-to pattern, Trunk flexed Gait velocity: decr     General Gait Details: hop-to gait,  close guard for safety. good maintenance of NWB RLE   Stairs             Wheelchair Mobility     Tilt Bed    Modified Rankin (Stroke Patients Only)       Balance Overall balance assessment: Needs assistance Sitting-balance support: Feet supported Sitting balance-Leahy Scale: Good     Standing balance support: Bilateral upper extremity supported, Single extremity supported Standing balance-Leahy Scale: Poor Standing balance comment: reliant on RW and guard assist                            Communication Communication Communication: No apparent difficulties  Cognition Arousal: Alert Behavior During Therapy: WFL for tasks assessed/performed   PT - Cognitive impairments: No apparent impairments  Following commands: Intact      Cueing Cueing Techniques: Verbal cues  Exercises      General Comments General comments (skin integrity, edema, etc.): sitting BP 124/66.  Would not register in standing however pt not dizzy.  End of session BP with pt in bed, 136/79      Pertinent Vitals/Pain Pain Assessment Pain Assessment: Faces Faces Pain Scale: Hurts little more Pain Location: RLE Pain Descriptors / Indicators: Aching, Grimacing, Discomfort Pain Intervention(s): Limited activity within patient's  tolerance, Monitored during session, Repositioned, Premedicated before session    Home Living Family/patient expects to be discharged to:: Private residence Living Arrangements: Spouse/significant other Available Help at Discharge: Family Type of Home: House Home Access: Ramped entrance       Home Layout: One level Home Equipment: Hand held shower head;Shower seat      Prior Function            PT Goals (current goals can now be found in the care plan section) Acute Rehab PT Goals Patient Stated Goal: home Progress towards PT goals: Progressing toward goals    Frequency    Min 3X/week      PT Plan      Co-evaluation              AM-PAC PT "6 Clicks" Mobility   Outcome Measure  Help needed turning from your back to your side while in a flat bed without using bedrails?: A Little Help needed moving from lying on your back to sitting on the side of a flat bed without using bedrails?: A Little Help needed moving to and from a bed to a chair (including a wheelchair)?: A Little Help needed standing up from a chair using your arms (e.g., wheelchair or bedside chair)?: A Little Help needed to walk in hospital room?: A Little Help needed climbing 3-5 steps with a railing? : A Lot 6 Click Score: 17    End of Session Equipment Utilized During Treatment: Gait belt Activity Tolerance: Patient tolerated treatment well Patient left: with call bell/phone within reach;in bed Nurse Communication: Mobility status PT Visit Diagnosis: Other abnormalities of gait and mobility (R26.89);Muscle weakness (generalized) (M62.81)     Time: 0981-1914 PT Time Calculation (min) (ACUTE ONLY): 13 min  Charges:    $Gait Training: 8-22 mins PT General Charges $$ ACUTE PT VISIT: 1 Visit                     Lucion Dilger M,PT Acute Rehab Services [phone]    Florencia Hunter 01/01/2024, 11:05 AM

## 2024-01-01 NOTE — Progress Notes (Signed)
 Orthopaedic Trauma Service Progress Note  Patient ID: [name] MRN: [id number] DOB/AGE: [birth year] 66 y.o.  Subjective:  No new issues Plan for home today   ROS As above  Today's  total administered Morphine Milligram Equivalents: 10 Yesterday's total administered Morphine Milligram Equivalents: 20  Objective:   VITALS:   Vitals:   12/31/23 1712 12/31/23 2159 01/01/24 0057 01/01/24 0759  BP: 124/75 135/74 112/68 136/72  Pulse: 94 (!) 109 91 (!) 108  Resp: 18 18 18 18   Temp: 97.9 F (36.6 C) 98.2 F (36.8 C) 98.3 F (36.8 C) 98.8 F (37.1 C)  TempSrc: Oral Oral Oral Oral  SpO2: 99% 99% 90% 98%  Weight:      Height:        Estimated body mass index is 32.27 kg/m as calculated from the following:   Height as of this encounter: 5\' 4"  (1.626 m).   Weight as of this encounter: 85.3 kg.   Intake/Output      04/25 0701 04/26 0700 04/26 0701 04/27 0700   P.O.     I.V. (mL/kg)     IV Piggyback     Total Intake(mL/kg)     Urine (mL/kg/hr)     Stool     Blood     Total Output     Net          Urine Occurrence 1 x    Stool Occurrence 2 x      LABS  Results for orders placed or performed during the hospital encounter of 12/29/23 (from the past 24 hours)  CBC     Status: None   Collection Time: 01/01/24  5:01 AM  Result Value Ref Range   WBC 7.3 4.0 - 10.5 K/uL   RBC 3.95 3.87 - 5.11 MIL/uL   Hemoglobin 12.0 12.0 - 15.0 g/dL   HCT 95.2 84.1 - 32.4 %   MCV 91.6 80.0 - 100.0 fL   MCH 30.4 26.0 - 34.0 pg   MCHC 33.1 30.0 - 36.0 g/dL   RDW 40.1 02.7 - 25.3 %   Platelets 233 150 - 400 K/uL   nRBC 0.0 0.0 - 0.2 %     PHYSICAL EXAM:   Gen: sitting up in chair, looks good, NAD Lungs: unlabored Ext:       Right Lower Extremity Dressing is clean, dry and intact  Extremity is warm  No DCT  Compartments are soft  No pain out of proportion with passive stretching of his  toes or ankle  DPN, SPN, TN sensory functions are intact  EHL, FHL, lesser toe motor functions intact  Ankle flexion, extension, inversion eversion intact  + DP pulse    Assessment/Plan: 2 Days Post-Op   Principal Problem:   Tibial fracture Active Problems:   Closed fracture of lateral portion of right tibial plateau   Anti-infectives (From admission, onward)    Start     Dose/Rate Route Frequency Ordered Stop   12/30/23 2200  vancomycin  (VANCOCIN ) IVPB 1000 mg/200 mL premix        1,000 mg 200 mL/hr over 60 Minutes Intravenous Every 12 hours 12/30/23 1322 12/30/23 2259   12/30/23 1159  vancomycin  (VANCOCIN ) powder  Status:  Discontinued          As needed 12/30/23 1159 12/30/23 1216   12/30/23  1100  vancomycin  (VANCOCIN ) IVPB 1000 mg/200 mL premix        1,000 mg 200 mL/hr over 60 Minutes Intravenous On call to O.R. 12/30/23 1014 12/30/23 1154   12/30/23 1100  levofloxacin  (LEVAQUIN ) IVPB 500 mg        500 mg 100 mL/hr over 60 Minutes Intravenous On call to O.R. 12/30/23 1014 12/30/23 1233   12/30/23 1018  levofloxacin  (LEVAQUIN ) 500 MG/100ML IVPB       Note to Pharmacy: Emeterio Hansen, GRETA: cabinet override      12/30/23 1018 12/30/23 1133     .  POD/HD#: 30  68 year old female ground-level fall with right tibial plateau fracture  - fall  - R tibial plateau fracture s/p ORIF   Weightbearing: NWB RLE ROM: Okay for unrestricted ROM Incisional and dressing care: Reinforce dressings as needed  Showering: Okay to begin showering getting incision wet 01/03/24 Orthopedic device(s): None  Pain management: Continue current multimodal regimen VTE prophylaxis: Lovenox , SCDs ID: Vancomycin  post op Foley/Lines:  No foley, KVO IVFs Impediments to Fracture Healing: Vitamin D  level 53, no additional supplementation needed Dispo: PT/OT evaluation today, currently recommending home health therapies.  Okay for discharge from ortho standpoint once cleared by medicine team and  therapies   D/C recommendations: - Tramadol , home dose Flexeril, Tylenol  as needed for pain control - Aspirin 81 mg daily x 30 days for DVT prophylaxis - No additional need for Vit D supplementation   Follow - up plan: 2 weeks after d/c for wound check and repeat x-rays     Geroldine Kotyk, PA-C [phone] (C) 01/01/2024, 11:24 AM  [facility] [address] [place] [address] [phone] Deanna [phone] (F)    After 5pm and on the weekends please log on to Amion, go to orthopaedics and the look under the [facility] Call for the provider(s) on call. You can also call our office at [phone] and then follow the prompts to be connected to the call team.  Patient ID: [name], female   DOB: 1955-12-13, 68 y.o.   MRN: [id number]

## 2024-01-01 NOTE — Evaluation (Signed)
 Occupational Therapy Evaluation Patient Details Name: [name] MRN: [id number] DOB: Jun 02, 1956 Today's Date: 01/01/2024   History of Present Illness   Pt is a 68 y.o. female presented to [facility] ED 4/23 for fall at work. CT showed R lateral tibial plateau fx. R ORIF performed 4/24. NWB  PMH: HTN, arthritis     Clinical Impressions Pt reported at PLOF was independent and working. She was agreeable to session and started with step pivot transfers to [facility] with CGA and use of RW. Pt then completed toileting tasks with BSC placed over commode and was able to hygiene with lateral lean and CGA. She then completed UE bathing at sink post set up and then wanted to attempt one attempt on ambulation with RW. She was able to take 7 hop steps turn around and return to chair with RW and CGA while following WB precautions. Recommendation for [facility] therapy and family support with the return to home.     If plan is discharge home, recommend the following:   A little help with walking and/or transfers;A little help with bathing/dressing/bathroom;Assistance with cooking/housework;Assist for transportation;Help with stairs or ramp for entrance     Functional Status Assessment   Patient has had a recent decline in their functional status and demonstrates the ability to make significant improvements in function in a reasonable and predictable amount of time.     Equipment Recommendations   Wheelchair cushion (measurements OT);Wheelchair (measurements OT);BSC/3in1 (RW)     Recommendations for Other Services         Precautions/Restrictions   Precautions Precautions: Fall Recall of Precautions/Restrictions: Intact Restrictions Weight Bearing Restrictions Per Provider Order: Yes RLE Weight Bearing Per Provider Order: Non weight bearing     Mobility Bed Mobility Overal bed mobility: Modified Independent Bed Mobility: Supine to Sit     Supine to sit: Modified independent  (Device/Increase time)     General bed mobility comments: increase in time but simulated without bed rails and flat    Transfers Overall transfer level: Needs assistance Equipment used: Rolling walker (2 wheels) Transfers: Sit to/from Stand Sit to Stand: Contact guard assist Stand pivot transfers: Contact guard assist   Step pivot transfers: Contact guard assist            Balance Overall balance assessment: Needs assistance Sitting-balance support: Feet supported Sitting balance-Leahy Scale: Good     Standing balance support: Bilateral upper extremity supported, Single extremity supported Standing balance-Leahy Scale: Fair Standing balance comment: reliant on external support                           ADL either performed or assessed with clinical judgement   ADL Overall ADL's : Needs assistance/impaired Eating/Feeding: Independent;Sitting   Grooming: Wash/dry hands;Wash/dry face;Oral care;Set up;Sitting Grooming Details (indicate cue type and reason): set up in wc at sink Upper Body Bathing: Set up;Sitting   Lower Body Bathing: Moderate assistance;Cueing for safety;Cueing for sequencing;Sitting/lateral leans   Upper Body Dressing : Set up;Sitting   Lower Body Dressing: Moderate assistance;Sitting/lateral leans   Toilet Transfer: Contact guard assist;Stand-pivot;Cueing for sequencing;Cueing for safety;BSC/3in1;Grab bars   Toileting- Clothing Manipulation and Hygiene: Contact guard assist;Sitting/lateral lean       Functional mobility during ADLs: Contact guard assist;Rolling walker (2 wheels);Cueing for sequencing;Cueing for safety       Vision Baseline Vision/History: 0 No visual deficits Ability to See in Adequate Light: 0 Adequate Patient Visual Report: No change from baseline Vision Assessment?: No  apparent visual deficits     Perception Perception: Within Functional Limits       Praxis Praxis: WFL       Pertinent Vitals/Pain Pain  Assessment Pain Assessment: 0-10 Pain Score: 6  Pain Location: RLE Pain Descriptors / Indicators: Aching, Grimacing, Discomfort Pain Intervention(s): Limited activity within patient's tolerance, Monitored during session, RN gave pain meds during session, Ice applied     Extremity/Trunk Assessment Upper Extremity Assessment Upper Extremity Assessment: Overall WFL for tasks assessed   Lower Extremity Assessment Lower Extremity Assessment: Defer to PT evaluation   Cervical / Trunk Assessment Cervical / Trunk Assessment: Normal   Communication Communication Communication: No apparent difficulties   Cognition Arousal: Alert Behavior During Therapy: WFL for tasks assessed/performed Cognition: No apparent impairments                               Following commands: Intact       Cueing  General Comments   Cueing Techniques: Verbal cues      Exercises     Shoulder Instructions      Home Living Family/patient expects to be discharged to:: Private residence Living Arrangements: Spouse/significant other Available Help at Discharge: Family Type of Home: House Home Access: Ramped entrance     Home Layout: One level     Bathroom Shower/Tub: Tub/shower unit;Walk-in shower   Bathroom Toilet: Handicapped height     Home Equipment: Hand held shower head;Shower seat          Prior Functioning/Environment Prior Level of Function : Independent/Modified Independent;Working/employed;Driving             Mobility Comments: 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 [employer] ADLs Comments: indep    OT Problem List: Decreased strength;Decreased activity tolerance;Impaired balance (sitting and/or standing);Decreased safety awareness;Decreased knowledge of use of DME or AE;Cardiopulmonary status limiting activity;Pain   OT Treatment/Interventions: Self-care/ADL training;Therapeutic exercise;DME and/or AE instruction;Therapeutic activities;Patient/family education;Balance  training      OT Goals(Current goals can be found in the care plan section)   Acute Rehab OT Goals Patient Stated Goal: to go home OT Goal Formulation: With patient Time For Goal Achievement: 01/15/24 Potential to Achieve Goals: Good   OT Frequency:  Min 1X/week    Co-evaluation              AM-PAC OT "6 Clicks" Daily Activity     Outcome Measure Help from another person eating meals?: None Help from another person taking care of personal grooming?: A Little Help from another person toileting, which includes using toliet, bedpan, or urinal?: A Little Help from another person bathing (including washing, rinsing, drying)?: A Lot Help from another person to put on and taking off regular upper body clothing?: A Little Help from another person to put on and taking off regular lower body clothing?: A Lot 6 Click Score: 17   End of Session Equipment Utilized During Treatment: Gait belt;Rolling walker (2 wheels) Nurse Communication: Mobility status;Other (comment) (pain)  Activity Tolerance: Patient tolerated treatment well Patient left: in chair;with call bell/phone within reach  OT Visit Diagnosis: Unsteadiness on feet (R26.81);Other abnormalities of gait and mobility (R26.89);Repeated falls (R29.6);Muscle weakness (generalized) (M62.81);Pain Pain - Right/Left: Right Pain - part of body: Leg                Time: 0730-0820 OT Time Calculation (min): 50 min Charges:  OT General Charges $OT Visit: 1 Visit OT Evaluation $OT Eval Low Complexity:  1 Low OT Treatments $Self Care/Home Management : 23-37 mins  Erving Heather OTR/L  Acute Rehab Services  [phone] office number   Stevphen Elders 01/01/2024, 8:29 AM

## 2024-01-01 NOTE — Progress Notes (Signed)
 Reviewed AVS with patient All Questions Answered floor to finish discharge.  IV removed and pressure held.

## 2024-01-03 NOTE — TOC CM/SW Note (Signed)
 Emailed discharge summary to :   Adjustor  [place]   [phone] ext [address]  Claim [id number] [address] [place] , [place] [address]

## 2024-01-05 ENCOUNTER — Encounter (HOSPITAL_COMMUNITY): Payer: Self-pay | Admitting: Student

## 2024-01-05 ENCOUNTER — Other Ambulatory Visit (HOSPITAL_BASED_OUTPATIENT_CLINIC_OR_DEPARTMENT_OTHER): Payer: Self-pay | Admitting: Student

## 2024-01-06 ENCOUNTER — Other Ambulatory Visit (HOSPITAL_COMMUNITY): Payer: Self-pay | Admitting: Student

## 2024-01-06 DIAGNOSIS — M79604 Pain in right leg: Secondary | ICD-10-CM

## 2024-01-06 DIAGNOSIS — M7989 Other specified soft tissue disorders: Secondary | ICD-10-CM

## 2024-01-07 ENCOUNTER — Ambulatory Visit (HOSPITAL_COMMUNITY)
Admission: RE | Admit: 2024-01-07 | Discharge: 2024-01-07 | Disposition: A | Discharge status: Home / Self Care | Payer: Worker's Compensation | Source: Ambulatory Visit | Attending: Vascular Surgery

## 2024-01-07 DIAGNOSIS — M79661 Pain in right lower leg: Secondary | ICD-10-CM | POA: Diagnosis present

## 2024-01-07 DIAGNOSIS — M7989 Other specified soft tissue disorders: Secondary | ICD-10-CM | POA: Diagnosis not present
